# Patient Record
Sex: Female | Born: 1955 | Race: White | Hispanic: No | Marital: Married | Smoking: Never smoker
Health system: Southern US, Community
[De-identification: ages and names within clinical notes are randomized; demographics above are authoritative.]

## PROBLEM LIST (undated history)

## (undated) DIAGNOSIS — I10 Essential (primary) hypertension: Secondary | ICD-10-CM

## (undated) DIAGNOSIS — E785 Hyperlipidemia, unspecified: Secondary | ICD-10-CM

## (undated) DIAGNOSIS — E559 Vitamin D deficiency, unspecified: Secondary | ICD-10-CM

## (undated) DIAGNOSIS — R229 Localized swelling, mass and lump, unspecified: Secondary | ICD-10-CM

## (undated) DIAGNOSIS — M1990 Unspecified osteoarthritis, unspecified site: Secondary | ICD-10-CM

## (undated) DIAGNOSIS — K219 Gastro-esophageal reflux disease without esophagitis: Secondary | ICD-10-CM

## (undated) DIAGNOSIS — T7840XA Allergy, unspecified, initial encounter: Secondary | ICD-10-CM

## (undated) HISTORY — DX: Essential (primary) hypertension: I10

## (undated) HISTORY — DX: Localized swelling, mass and lump, unspecified: R22.9

## (undated) HISTORY — DX: Gastro-esophageal reflux disease without esophagitis: K21.9

## (undated) HISTORY — DX: Vitamin D deficiency, unspecified: E55.9

## (undated) HISTORY — PX: EYE SURGERY: SHX253

## (undated) HISTORY — DX: Hyperlipidemia, unspecified: E78.5

## (undated) HISTORY — DX: Allergy, unspecified, initial encounter: T78.40XA

## (undated) HISTORY — DX: Unspecified osteoarthritis, unspecified site: M19.90

---

## 1999-04-23 ENCOUNTER — Encounter: Admission: RE | Admit: 1999-04-23 | Discharge: 1999-04-23 | Payer: Self-pay | Admitting: Obstetrics and Gynecology

## 1999-04-23 ENCOUNTER — Encounter: Payer: Self-pay | Admitting: Obstetrics and Gynecology

## 1999-04-29 ENCOUNTER — Encounter (INDEPENDENT_AMBULATORY_CARE_PROVIDER_SITE_OTHER): Payer: Self-pay

## 1999-04-29 ENCOUNTER — Other Ambulatory Visit: Admission: RE | Admit: 1999-04-29 | Discharge: 1999-04-29 | Payer: Self-pay | Admitting: Obstetrics and Gynecology

## 2001-01-11 ENCOUNTER — Encounter: Admission: RE | Admit: 2001-01-11 | Discharge: 2001-01-11 | Payer: Self-pay | Admitting: Obstetrics and Gynecology

## 2001-01-11 ENCOUNTER — Encounter: Payer: Self-pay | Admitting: Obstetrics and Gynecology

## 2002-04-11 ENCOUNTER — Encounter: Admission: RE | Admit: 2002-04-11 | Discharge: 2002-04-11 | Payer: Self-pay | Admitting: Obstetrics and Gynecology

## 2002-04-11 ENCOUNTER — Encounter: Payer: Self-pay | Admitting: Obstetrics and Gynecology

## 2002-06-24 ENCOUNTER — Emergency Department (HOSPITAL_COMMUNITY): Admission: EM | Admit: 2002-06-24 | Discharge: 2002-06-24 | Payer: Self-pay

## 2003-04-10 ENCOUNTER — Encounter: Admission: RE | Admit: 2003-04-10 | Discharge: 2003-04-10 | Payer: Self-pay | Admitting: Obstetrics and Gynecology

## 2004-03-06 ENCOUNTER — Ambulatory Visit: Payer: Self-pay | Admitting: Cardiology

## 2004-04-09 ENCOUNTER — Ambulatory Visit: Payer: Self-pay | Admitting: Cardiology

## 2004-04-30 ENCOUNTER — Encounter: Admission: RE | Admit: 2004-04-30 | Discharge: 2004-04-30 | Payer: Self-pay | Admitting: Obstetrics and Gynecology

## 2005-05-15 ENCOUNTER — Encounter: Admission: RE | Admit: 2005-05-15 | Discharge: 2005-05-15 | Payer: Self-pay | Admitting: Obstetrics and Gynecology

## 2006-03-18 ENCOUNTER — Ambulatory Visit: Payer: Self-pay | Admitting: Cardiology

## 2006-03-18 ENCOUNTER — Observation Stay (HOSPITAL_COMMUNITY): Admission: EM | Admit: 2006-03-18 | Discharge: 2006-03-19 | Payer: Self-pay | Admitting: Emergency Medicine

## 2006-03-20 ENCOUNTER — Ambulatory Visit: Payer: Self-pay

## 2006-04-22 ENCOUNTER — Ambulatory Visit: Payer: Self-pay | Admitting: Cardiology

## 2006-05-18 ENCOUNTER — Encounter: Admission: RE | Admit: 2006-05-18 | Discharge: 2006-05-18 | Payer: Self-pay | Admitting: Obstetrics and Gynecology

## 2007-05-21 ENCOUNTER — Encounter: Admission: RE | Admit: 2007-05-21 | Discharge: 2007-05-21 | Payer: Self-pay | Admitting: Obstetrics and Gynecology

## 2007-05-26 ENCOUNTER — Ambulatory Visit: Payer: Self-pay | Admitting: Cardiology

## 2009-05-09 ENCOUNTER — Encounter: Admission: RE | Admit: 2009-05-09 | Discharge: 2009-05-09 | Payer: Self-pay | Admitting: Family Medicine

## 2009-07-18 ENCOUNTER — Encounter: Admission: RE | Admit: 2009-07-18 | Discharge: 2009-07-18 | Payer: Self-pay | Admitting: Obstetrics and Gynecology

## 2010-08-05 ENCOUNTER — Other Ambulatory Visit: Payer: Self-pay | Admitting: Family Medicine

## 2010-08-05 DIAGNOSIS — Z1231 Encounter for screening mammogram for malignant neoplasm of breast: Secondary | ICD-10-CM

## 2010-08-06 NOTE — Assessment & Plan Note (Signed)
[facility]                            CARDIOLOGY OFFICE NOTE   AMAKA, GLUTH                       MRN:          [id number]  DATE:05/26/2007                            DOB:          Jan 01, 1956    REASON FOR PRESENTATION:  Evaluate patient with palpitations and  hypertension.   HISTORY OF PRESENT ILLNESS:  The patient was in this clinic in December  2005 and again in late 2007.  In 2005 she had an exercise treadmill test  which was negative for any evidence of ischemia.  In 2007 she had a  stress perfusion study which demonstrated no sign of scar or ischemia.  She was referred back because she is wanting to embark on an exercise  regimen.  She needs to lose weight and she is upset that she has not.  She is also referred back because she had palpitations once after doing  activity.  She had been very sedentary.  In December of last year she  moved some furniture to put up a Christmas tree.  She said that night  she noted her heart rate to be 125 and sustained.  Her blood pressure  was elevated in the 160s.  It came down by the next morning.  She has  not had any further episodes of tachypalpitations or irregular heart  beat since then.  She has had no presyncope or syncope.  She keeps her  blood pressure routinely and though it fluctuates slightly, I think the  mean is in the high 120s to low 130s systolic and diastolics in the 70s-  80s.  She has not been having any chest discomfort, neck or arm  discomfort.  She has not been having any shortness of breath nor PND or  orthopnea.  Again, she has not been active.   PAST MEDICAL HISTORY:  1. Hypertension.  2. Dyslipidemia.   PAST SURGICAL HISTORY:  C-section.   ALLERGIES:  PENICILLIN, SULFA.   MEDICATIONS:  1. Crestor 5 mg daily.  2. Metoprolol 100 mg daily.  3. Calcium.  4. Multivitamin.  5. Cymbalta.  6. Amlodipine 5 mg daily.  7. Lisinopril 20 mg b.i.d..   SOCIAL HISTORY:  The  patient is retired from [employer].  She is married and  has two children.  She quit smoking in 1974 after one pack of cigarettes  for 20 years.  She does not drink alcohol.   FAMILY HISTORY:  Contributory for two brothers dying with myocardial  infarction.  Her father had later-onset heart disease.   REVIEW OF SYSTEMS:  As stated in the HPI, otherwise negative for other  systems.   PHYSICAL EXAMINATION:  The patient is in no distress.  Blood pressure 124/70, heart rate 66 and regular, body mass index 26.  HEENT:  Eyelids unremarkable.  Pupils equal, round and react to light,  fundi not visualized, oral mucosa remarkable.  NECK:  No jugular distention at 45 degrees, carotid upstroke brisk and  symmetric, no bruits, no thyromegaly.  LYMPHATICS:  No cervical, axillary or inguinal adenopathy.  LUNGS:  Clear to auscultation bilaterally.  BACK:  No costovertebral angle mass.  CHEST:  Unremarkable.  HEART:  PMI not displaced or sustained, S1 and S2 within normal limits,  no S3, no S4, no clicks, no rubs, no murmurs.  ABDOMEN:  Obese, positive bowel sounds, normal in frequency and pitch,  no bruits, no rebound, no guarding, no midline pulsatile mass.  No  hepatomegaly, no splenomegaly.  SKIN:  No rashes, no nodules.  EXTREMITIES:  2+ pulses throughout, no edema, no cyanosis, no clubbing.  NEUROLOGIC:  Oriented to person, place, time.  Cranial nerves II-XII  grossly intact.  Motor grossly intact.   EKG:  Sinus rhythm, rate 76, axis within normal limits, intervals within  normal limits, poor anterior R-wave progression, no acute ST-wave  change.   ASSESSMENT/PLAN:  1. Palpitations.  The patient had one episode of sustained      tachypalpitations but has not had a repeat of this.  At this point      no further cardiovascular testing is suggested.  2. Obesity.  I am glad that she wants to lose weight, and diet and      exercise is the right way to do this.  I suggested a plan on an       exercise treadmill; however, she has had two stress tests and was      reluctant to have this done again.  She remembers the guidelines I      gave her for exercise based on her first study.  She will try to      check her own blood pressure with exercise to make sure she is not      having any accelerated blood pressure response.  I would not think      that she had any new obstructive coronary disease and would not      need further testing for this.  I did discuss with her a minimum      exercise regimen of 30 minutes 5 days a week.  New guidelines last      year suggested up to 300 minutes a week afforded additional      benefit.  3. Hypertension.  Her blood pressure is well-controlled.  She was      anxious about some blood pressures occasionally 140s-150s but I      tried to reassure her.  I think overall her pressure is well-      controlled and with weight loss will even be better.  I would not      suggest changing her medical regimen.  4. Follow-up can be in this clinic as needed.    Rollene Rotunda, MD, [facility]  Electronically Signed   JH/MedQ  DD: 05/26/2007  DT: 05/27/2007  Job #: [id number]   cc:   Lindaann Pascal, PA

## 2010-08-09 NOTE — Discharge Summary (Signed)
Gina Murray, Gina Murray NO.:  [id number]   MEDICAL RECORD NO.:  [id number]          PATIENT TYPE:  INP   LOCATION:  2034                         FACILITY:  [facility]   PHYSICIAN:  Rollene Rotunda, MD, FACCDATE OF BIRTH:  [birth year]   DATE OF ADMISSION:  03/18/2006  DATE OF DISCHARGE:  03/19/2006                               DISCHARGE SUMMARY   PRIMARY CARDIOLOGIST:  Dr. Antoine Poche   PRIMARY CARE:  The patient is followed by Lindaann Pascal, PA at [facility]   DISCHARGING PHYSICIAN:  Dr. Antoine Poche   DISCHARGING DIAGNOSES:  1. Atypical chest pain, no subjective finding/evidence of ischemia.      Patient to be discharged home to follow up with exercise Cardiolite      outpatient.  2. Hypertension with lisinopril initiated during this hospitalization.   PAST MEDICAL HISTORY:  Includes:  1. Hypertension x3 years.  2. Hypercholesteremia.  3. Obesity.  4. Status post C-section.  5. Status post gated exercise in 2005.   PROCEDURES THIS ADMISSION:  1. Chest x-ray showed no acute cardiopulmonary disease.  2. EKG showed sinus rhythm without acute ST or T wave changes.   HOSPITAL COURSE:  [name] is a 55 year old female previously seen by  Dr. Antoine Poche approximately three years ago at which time she had a gated  stress test, no followup since that time, is followed by [facility].  She presented to [facility] emergency room  complaining of chest discomfort x1 week, she described it as a mid-  sternal heaviness with heartburn, she is taking Tums with relief,  however pain returns with some palpitations in chest, no other  associated symptoms.  [name] states she had been under a lot of  stress caring for her mother with Alzheimer's.  She also noted her blood  pressure to be elevated over the last few days prior to admission,  previously had been under control.  [name] felt like the additional  stress was  wearing on her.  She was seen by Dr. Juanda Chance and admitted to  telemetry, cardiac markers were cycled, EKG was without acute changes,  previous exercise stress was negative three years ago.  Dr. Antoine Poche in  to see patient on day of discharge, cardiac troponin negative x2 sets,  patient's blood pressure more control at 141/72, heart rate in the 70s,  afebrile, no further episodes of chest discomfort.  Patient being  discharged home to follow up outpatient.  I have scheduled her for a  stress Myoview on December 28 at 12 noon, she  has been given the  discharge instructions regarding her stress test and then to follow up  with Dr. Antoine Poche January 30 at 1:15 p.m. in [place].  At time of  discharge, she is given a prescription for nitroglycerin p.r.n.,  lisinopril 5 mg p.o. daily which is new, Toprol XL 100 mg daily, Crestor  5 mg  daily, aspirin 81 mg daily and Protonix 40 mg daily.  Patient also  scheduled for a B-Met on January 30 at 1:15 when she sees Dr.  Hochrein.   Duration of discharge encounter is 30 minutes.      Dorian Pod, ACNP      Rollene Rotunda, MD, [facility]  Electronically Signed    MB/MEDQ  D:  05/22/2006  T:  05/23/2006  Job:  [id number]   cc:   Lindaann Pascal, P.A.

## 2010-08-09 NOTE — Assessment & Plan Note (Signed)
[facility]                            CARDIOLOGY OFFICE NOTE   Gina, Murray                       MRN:          [id number]  DATE:04/22/2006                            DOB:          11/16/1955    PRIMARY CARE PHYSICIAN:  Lindaann Pascal, P.A.   REASON FOR PRESENTATION:  Evaluate patient with chest pain.   HISTORY OF PRESENT ILLNESS:  The patient was admitted on December 26  with chest discomfort.  She ruled out for myocardial infarction.  She  had a stress perfusion study as an outpatient that demonstrated an EF of  78% with no evidence of ischemia or infarct.  She subsequently has had  no further chest pain.  In particular, she started Prilosec and has had  resolution of her discomfort.  She denies any shortness of breath and  has had no PND or orthopnea.  She has not had any palpitation,  presyncope or syncope.   PAST MEDICAL HISTORY:  Hypertension, dyslipidemia, C-section.   ALLERGIES:  PENICILLIN and SULFA.   CURRENT MEDICATIONS:  1. Crestor 5 mg daily.  2. Lisinopril 5 mg daily.  3. Metoprolol 100 mg daily.  4. Protonix 40 mg daily.  5. Calcium.  6. Multivitamin.   REVIEW OF SYSTEMS:  As stated in the HPI.  Otherwise negative for other  systems.   PHYSICAL EXAMINATION:  The patient is in no distress.  Her blood pressure 150/80, heart rate 73 and regular, weight 172 pounds.  Body mass index 30.  HEENT:  Eyes unremarkable, pupils equal, round and reactive to light.  Fundi not visualized.  Oral mucosa unremarkable.  NECK:  No jugular venous distention.  Waveform within normal limits,  carotid upstroke brisk and symmetrical.  No bruits. No thyromegaly.  LYMPHATICS:  No cervical, axillary, or inguinal adenopathy.  LUNGS:  Clear to auscultation bilaterally.  BACK:  No costovertebral angle tenderness.  CHEST:  Unremarkable.  HEART:  PMI not displaced or sustained.  S1 and S2 within normal limits,  no S3, no S4, no murmurs.  ABDOMEN:   Obese, positive bowel sounds.  Normal in frequency and pitch,  no bruits, no rebound, no guarding, no midline pulsatile mass, no  hepatosplenomegaly, no splenomegaly.  SKIN:  No rashes.  No nodules.  EXTREMITIES:  2+ pulses throughout, no edema, no cyanosis, no clubbing.  NEURO:  Oriented to person, place and time.  Cranial nerves II through  XII grossly intact.  Motor grossly intact.   EKG:  Sinus rhythm, rate 73, axis within normal limits. Intervals within  normal limits, no acute ST-T wave changes.   ASSESSMENT AND PLAN:  1. Chest discomfort.  The patient's chest discomfort probably has a      gastrointestinal etiology.  I told her that she should continue the      Protonix only for a month.  She should stop it and if her symptoms      come back go to see her primary caregiver.  She may need      gastrointestinal evaluation.  2. Hypertension.  The blood pressure has been elevated  at multiple      visits.  I have taken the liberty of increasing her Lisinopril to      10 mg daily.  She has been given written instructions to get a BMET      in 2 weeks.  3. Obesity.  She understands the need to lose weight with diet and      exercise.  4. Followup.  I will see her back as needed.     Rollene Rotunda, MD, [facility]  Electronically Signed    JH/MedQ  DD: 04/22/2006  DT: 04/22/2006  Job #: [id number]   cc:   Gae Gallop. Long

## 2010-08-09 NOTE — H&P (Signed)
Gina Murray, Gina Murray NO.:  [id number]   MEDICAL RECORD NO.:  [id number]          PATIENT TYPE:  EMS   LOCATION:  MAJO                         FACILITY:  [facility]   PHYSICIAN:  Everardo Beals. Juanda Chance, MD, FACCDATE OF BIRTH:  June 30, 1955   DATE OF ADMISSION:  03/18/2006  DATE OF DISCHARGE:                              HISTORY & PHYSICAL   PRIMARY CARDIOLOGIST:  Dr. Rollene Rotunda.   PRIMARY CARE Aryanah Enslow:  Lindaann Pascal, P.A.   HISTORY OF PRESENT ILLNESS:  This is a 55 year old pleasant Caucasian  female who has been experiencing chest pressure over the last couple of  weeks with and without exertion.  The patient also complained of some  mild heartburn-type symptoms.  She took some Tums and the pain went  away.  The pain does come back, lasting approximately half a day at a  time.  The patient has been noticing some fluttering in her chest,  lasting 1 to 2 seconds, with and without exertion.  The pain is not  awaking her.  It is not associated with nausea, vomiting, diaphoresis,  dizziness, or shortness of breath.  The patient states that if she does  not think about it, she normally does not feel it, but at other times  she can feel it and it just feels like pressure.   The patient admits to being under a lot of stress at home.  She has a  mother with Alzheimer's whom she takes care of.  Stays 8 hours a day  with her, along with her husband's father is dying, and she is concerned  and taking care of him as well.  He is part of a hospice program.  The  patient is also very much involved with her family and easily worries  about them and is usually busy taking care of them.   The patient denies any medical noncompliance.  She states that she is  trying to lose some weight, but it has been difficult for her.  She  states that she has noticed the pressure over the 2 weeks and decided to  see her primary care physician because of this.  In his office, her  blood pressure  systolically was 190.  She was given nitroglycerin  sublingual and transferred to [facility] ER for further evaluation.  On  arrival, blood pressure was 165/75, pulse 80, respirations 22, with a  temperature of 97.5.   PAST MEDICAL HISTORY:  Includes:  1. Hypertension x3 years.  2. Hypercholesterolemia.  3. Obesity.   PAST CARDIAC WORKUP:  The patient did have a treadmill stress test in  2005 by Dr. Rollene Rotunda, which was negative, with poor exercise  tolerance.  The patient was advised to lose weight and monitor her blood  pressure.   PAST SURGICAL HISTORY:  C-section.   SOCIAL HISTORY:  She lives in [place] with her husband.  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  She  is married.  Has one daughter.  Denies using alcohol or tobacco.  No  illicit  drug use.  Does not get any regular exercise.  No herbal  medicine use.   FAMILY HISTORY:  Mother with Alzheimer's, CHF, and hypertension.  Father  died of kidney disease and coronary artery disease.  She has one sister  with COPD.   REVIEW OF SYSTEMS:  As above; otherwise negative.   CURRENT MEDICATIONS:  At home:  1. Toprol XL 100 mg daily.  2. Crestor 5 mg q.h.s.  3. Multivitamin once a day.  4. Calcium once a day.   ALLERGIES:  1. SULFA.  2. PENICILLIN.   PHYSICAL EXAMINATION:  GENERAL:  She is awake, alert, oriented, in no  acute distress, with complaints of some minor chest discomfort.  HEENT:  Head is normocephalic, atraumatic.  Eyes:  PERRLA.  Mucous  membranes:  Mouth pink and moist.  Tongue is midline.  NECK:  Supple.  No JVD or carotid bruits appreciated.  CARDIOVASCULAR:  Regular rate and rhythm with S4 murmur auscultated and  1/6 systolic murmur auscultated at the left sternal border.  LUNGS:  Clear to auscultation without wheezes, rales, or rhonchi.  ABDOMEN:  Soft, nontender, with 2+ bowel sounds.  EXTREMITIES:  Without clubbing, cyanosis, or edema.  SKIN:  Warm  and dry.  NEURO:  Intact.   LABORATORY TESTS:  EKG revealing normal sinus rhythm with a ventricular  rate of 70 beats per minute.  Labs are pending.  Chest x-ray is pending.  Point-of-care markers are pending.   IMPRESSION:  1. Hypertension.  2. Chest pain.  Rule out cardiac etiology.  3. Hypercholesterolemia.  4. Stressful life circumstances.   PLAN:  The patient was seen and examined by Dr. Charlies Constable.  Our plan  will be to admit this patient to rule out ACS.  Restart the patient on  her Toprol-XL 100 mg once a day.  Start lisinopril 5 mg once a day.  Restart her Crestor as at home.  Start her on a proton pump inhibitor,  Protonix 40 mg once a day, and enteric-coated aspirin once a day.  We  will not start on any Lovenox or heparin at this time, secondary to  hypertension.  If cardiac enzymes are positive, then would institute  heparin or Lovenox.   The patient verbalizes understanding about need to be admitted and have  blood pressure better controlled.  Would also recommend case management  to assist her in the care of her mother with Alzheimer's to allow her to  have a break on occasion.  The patient will be followed throughout  hospitalization, and we will make further recommendations throughout  hospital course as needed.      Bettey Mare. Lyman Bishop, NP      Everardo Beals. Juanda Chance, MD, [facility]  Electronically Signed    KML/MEDQ  D:  03/18/2006  T:  03/19/2006  Job:  [id number]   cc:   Lindaann Pascal, P.A.

## 2010-08-14 ENCOUNTER — Ambulatory Visit
Admission: RE | Admit: 2010-08-14 | Discharge: 2010-08-14 | Disposition: A | Discharge status: Home / Self Care | Payer: BC Managed Care – PPO | Source: Ambulatory Visit | Attending: Family Medicine | Admitting: Family Medicine

## 2010-08-14 DIAGNOSIS — Z1231 Encounter for screening mammogram for malignant neoplasm of breast: Secondary | ICD-10-CM

## 2012-01-20 ENCOUNTER — Other Ambulatory Visit: Payer: Self-pay | Admitting: Family Medicine

## 2012-01-20 DIAGNOSIS — R1031 Right lower quadrant pain: Secondary | ICD-10-CM

## 2012-01-21 ENCOUNTER — Other Ambulatory Visit (HOSPITAL_COMMUNITY): Payer: Self-pay

## 2012-01-23 ENCOUNTER — Ambulatory Visit (HOSPITAL_COMMUNITY): Payer: Self-pay

## 2012-01-23 ENCOUNTER — Ambulatory Visit (HOSPITAL_COMMUNITY)
Admission: RE | Admit: 2012-01-23 | Discharge: 2012-01-23 | Disposition: A | Discharge status: Home / Self Care | Payer: BC Managed Care – PPO | Source: Ambulatory Visit | Attending: Family Medicine | Admitting: Family Medicine

## 2012-01-23 ENCOUNTER — Other Ambulatory Visit: Payer: Self-pay | Admitting: Family Medicine

## 2012-01-23 DIAGNOSIS — N200 Calculus of kidney: Secondary | ICD-10-CM | POA: Insufficient documentation

## 2012-01-23 DIAGNOSIS — R1031 Right lower quadrant pain: Secondary | ICD-10-CM

## 2012-01-23 DIAGNOSIS — R599 Enlarged lymph nodes, unspecified: Secondary | ICD-10-CM | POA: Insufficient documentation

## 2012-01-23 DIAGNOSIS — Q619 Cystic kidney disease, unspecified: Secondary | ICD-10-CM | POA: Insufficient documentation

## 2012-01-23 MED ORDER — IOHEXOL 300 MG/ML  SOLN
100.0000 mL | Freq: Once | INTRAMUSCULAR | Status: AC | PRN
  Administered 2012-01-23: 100 mL via INTRAVENOUS

## 2012-07-15 ENCOUNTER — Other Ambulatory Visit: Payer: Self-pay | Admitting: Physician Assistant

## 2012-08-04 ENCOUNTER — Other Ambulatory Visit: Payer: Self-pay | Admitting: Physician Assistant

## 2012-08-13 ENCOUNTER — Ambulatory Visit (INDEPENDENT_AMBULATORY_CARE_PROVIDER_SITE_OTHER): Payer: BC Managed Care – PPO | Admitting: Family Medicine

## 2012-08-13 ENCOUNTER — Encounter: Payer: Self-pay | Admitting: Family Medicine

## 2012-08-13 VITALS — BP 161/72 | HR 62 | Temp 97.0°F | Ht 62.0 in | Wt 172.0 lb

## 2012-08-13 DIAGNOSIS — E785 Hyperlipidemia, unspecified: Secondary | ICD-10-CM

## 2012-08-13 DIAGNOSIS — R229 Localized swelling, mass and lump, unspecified: Secondary | ICD-10-CM

## 2012-08-13 DIAGNOSIS — R609 Edema, unspecified: Secondary | ICD-10-CM

## 2012-08-13 DIAGNOSIS — I10 Essential (primary) hypertension: Secondary | ICD-10-CM

## 2012-08-13 DIAGNOSIS — E559 Vitamin D deficiency, unspecified: Secondary | ICD-10-CM

## 2012-08-13 LAB — COMPLETE METABOLIC PANEL WITH GFR
ALT: 25 U/L (ref 0–35)
AST: 23 U/L (ref 0–37)
Albumin: 4.2 g/dL (ref 3.5–5.2)
Alkaline Phosphatase: 114 U/L (ref 39–117)
BUN: 12 mg/dL (ref 6–23)
CO2: 28 mEq/L (ref 19–32)
Calcium: 10.7 mg/dL — ABNORMAL HIGH (ref 8.4–10.5)
Chloride: 102 mEq/L (ref 96–112)
Creat: 0.84 mg/dL (ref 0.50–1.10)
GFR, Est African American: 89 mL/min
GFR, Est Non African American: 77 mL/min
Glucose, Bld: 104 mg/dL — ABNORMAL HIGH (ref 70–99)
Potassium: 4.9 mEq/L (ref 3.5–5.3)
Sodium: 140 mEq/L (ref 135–145)
Total Bilirubin: 0.5 mg/dL (ref 0.3–1.2)
Total Protein: 7.4 g/dL (ref 6.0–8.3)

## 2012-08-13 MED ORDER — LISINOPRIL 20 MG PO TABS: ORAL_TABLET | ORAL | Status: DC

## 2012-08-13 MED ORDER — METOPROLOL SUCCINATE ER 100 MG PO TB24: ORAL_TABLET | ORAL | Status: DC

## 2012-08-13 MED ORDER — ATORVASTATIN CALCIUM 40 MG PO TABS: 40.0000 mg | ORAL_TABLET | Freq: Every day | ORAL | Status: DC

## 2012-08-13 NOTE — Patient Instructions (Signed)
      Dr Jobie Popp's Recommendations  Diet and Exercise discussed with patient.  For nutrition information, I recommend books:  1).Eat to Live by Dr Joel Fuhrman. 2).Prevent and Reverse Heart Disease by Dr Caldwell Esselstyn. 3) Dr Neal Barnard's Book: Reversing Diabetes  Exercise recommendations are:  If unable to walk, then the patient can exercise in a chair 3 times a day. By flapping arms like a bird gently and raising legs outwards to the front.  If ambulatory, the patient can go for walks for 30 minutes 3 times a week. Then increase the intensity and duration as tolerated.  Goal is to try to attain exercise frequency to 5 times a week.  If applicable: Best to perform resistance exercises (machines or weights) 2 days a week and cardio type exercises 3 days per week.  

## 2012-08-13 NOTE — Progress Notes (Signed)
Patient ID: Gina Murray, female   DOB: 11/20/1955, 57 y.o.   MRN: [id number] SUBJECTIVE: HPI: Patient is here for follow up of hypertension/hyperlipidemia:  denies Headache;deniesChest Pain;denies weakness;denies Shortness of Breath or Orthopnea;denies Visual changes;denies palpitations;denies cough;denies pedal edema;denies symptoms of TIA or stroke; admits to Compliance with medications. denies Problems with medications.   PMH/PSH: reviewed/updated in Epic  SH/FH: reviewed/updated in Epic  Allergies: reviewed/updated in Epic  Medications: reviewed/updated in Epic  Immunizations: reviewed/updated in Epic  ROS: As above in the HPI. All other systems are stable or negative.  OBJECTIVE: APPEARANCE:  Patient in no acute distress.The patient appeared well nourished and normally developed. Acyanotic. Waist: VITAL SIGNS:BP 161/72  Pulse 62  Temp(Src) 97 F (36.1 C) (Oral)  Ht 5\' 2"  (1.575 m)  Wt 172 lb (78.019 kg)  BMI 31.45 kg/m2  LMP 08/13/2001  obesity SKIN: warm and  Dry without overt rashes, tattoos and scars. There are many subcutaneous nodules in the lateral right thigh. 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  HEAD and Neck: without JVD, Head and scalp: normal Eyes:No scleral icterus. Fundi normal, eye movements normal. Ears: Auricle normal, canal normal, Tympanic membranes normal, insufflation normal. Nose: normal Throat: normal Neck & thyroid: normal  CHEST & LUNGS: Chest wall: normal Lungs: Clear  CVS: Reveals the PMI to be normally located. Regular rhythm, First and Second Heart sounds are normal,  absence of murmurs, rubs or gallops. Peripheral vasculature: Radial pulses: normal Dorsal pedis pulses: normal Posterior pulses: normal  ABDOMEN:  Appearance:obese Benign,, no organomegaly, no masses, no Abdominal Aortic enlargement. No Guarding , no rebound. No Bruits. Bowel sounds: normal  RECTAL:  N/A GU: N/A  EXTREMETIES: nonedematous. Both Femoral and Pedal pulses are normal.  MUSCULOSKELETAL:  Spine: normal Joints: intact  NEUROLOGIC: oriented to time,place and person; nonfocal. Strength is normal Sensory is normal Reflexes are normal Cranial Nerves are normal.  ASSESSMENT: HTN (hypertension) - Plan: metoprolol succinate (TOPROL-XL) 100 MG 24 hr tablet, lisinopril (PRINIVIL,ZESTRIL) 20 MG tablet, COMPLETE METABOLIC PANEL WITH GFR  Edema  Multiple skin nodules  HLD (hyperlipidemia) - Plan: atorvastatin (LIPITOR) 40 MG tablet, COMPLETE METABOLIC PANEL WITH GFR, NMR Lipoprofile with Lipids  Unspecified vitamin D deficiency - Plan: Vitamin D 25 hydroxy    PLAN:       Dr Woodroe Mode Recommendations  Diet and Exercise discussed with patient.  For nutrition information, I recommend books:  1).Eat to Live by Dr Monico Hoar. 2).Prevent and Reverse Heart Disease by Dr Suzzette Righter. 3) Dr Katherina Right Book: Reversing Diabetes  Exercise recommendations are:  If unable to walk, then the patient can exercise in a chair 3 times a day. By flapping arms like a bird gently and raising legs outwards to the front.  If ambulatory, the patient can go for walks for 30 minutes 3 times a week. Then increase the intensity and duration as tolerated.  Goal is to try to attain exercise frequency to 5 times a week.  If applicable: Best to perform resistance exercises (machines or weights) 2 days a week and cardio type exercises 3 days per week.   Orders Placed This Encounter  Procedures  . COMPLETE METABOLIC PANEL WITH GFR  . NMR Lipoprofile with Lipids  . Vitamin D 25 hydroxy   No results found for this or any previous visit. Meds ordered this encounter  Medications  . amLODipine (NORVASC) 10 MG tablet    Sig: Take 10  mg by mouth daily.   Marland Kitchen DISCONTD: atorvastatin (LIPITOR) 40 MG tablet    Sig:   . Cholecalciferol (VITAMIN D) 2000 UNITS tablet    Sig: Take  2,000 Units by mouth daily.  Marland Kitchen aspirin 81 MG tablet    Sig: Take 81 mg by mouth daily.  . Calcium Carbonate-Vitamin D (CALCIUM + D PO)    Sig: Take by mouth.  . metoprolol succinate (TOPROL-XL) 100 MG 24 hr tablet    Sig: TAKE ONE TABLET BY MOUTH ONE TIME DAILY    Dispense:  30 tablet    Refill:  11  . atorvastatin (LIPITOR) 40 MG tablet    Sig: Take 1 tablet (40 mg total) by mouth daily.    Dispense:  30 tablet    Refill:  11  . lisinopril (PRINIVIL,ZESTRIL) 20 MG tablet    Sig: TAKE ONE TABLET BY MOUTH TWICE DAILY    Dispense:  60 tablet    Refill:  11  discussed diet and exercise at length.  RTc 4 months. Expect her to drop off BP reading in 6 weeks.  Laiyla Slagel P. Modesto Charon, M.D.

## 2012-08-14 ENCOUNTER — Other Ambulatory Visit: Payer: Self-pay | Admitting: Physician Assistant

## 2012-08-14 LAB — VITAMIN D 25 HYDROXY (VIT D DEFICIENCY, FRACTURES): Vit D, 25-Hydroxy: 49 ng/mL (ref 30–89)

## 2012-08-18 LAB — NMR LIPOPROFILE WITH LIPIDS
Cholesterol, Total: 182 mg/dL (ref <200)
HDL Particle Number: 44.4 umol/L (ref >=30.5)
HDL Size: 9.6 nm (ref >=9.2)
HDL-C: 77 mg/dL (ref >=40)
LDL (calc): 92 mg/dL (ref <100)
LDL Particle Number: 883 nmol/L (ref <1000)
LDL Size: 21.5 nm (ref >20.5)
LP-IR Score: 29 (ref <=45)
Large HDL-P: 13.6 umol/L (ref >=4.8)
Large VLDL-P: 2.8 nmol/L — ABNORMAL HIGH (ref <=2.7)
Small LDL Particle Number: 253 nmol/L (ref <=527)
Triglycerides: 65 mg/dL (ref <150)
VLDL Size: 47.5 nm — ABNORMAL HIGH (ref <=46.6)

## 2012-08-19 NOTE — Progress Notes (Signed)
Quick Note:  Lab result close to goal. No change in Medications for now. No Change in plans and follow up.  ______ 

## 2012-12-16 ENCOUNTER — Ambulatory Visit (INDEPENDENT_AMBULATORY_CARE_PROVIDER_SITE_OTHER): Payer: BC Managed Care – PPO | Admitting: Family Medicine

## 2012-12-16 ENCOUNTER — Encounter: Payer: Self-pay | Admitting: Family Medicine

## 2012-12-16 VITALS — BP 147/75 | HR 72 | Temp 98.1°F | Wt 172.8 lb

## 2012-12-16 DIAGNOSIS — R739 Hyperglycemia, unspecified: Secondary | ICD-10-CM

## 2012-12-16 DIAGNOSIS — E785 Hyperlipidemia, unspecified: Secondary | ICD-10-CM | POA: Insufficient documentation

## 2012-12-16 DIAGNOSIS — I10 Essential (primary) hypertension: Secondary | ICD-10-CM

## 2012-12-16 DIAGNOSIS — R229 Localized swelling, mass and lump, unspecified: Secondary | ICD-10-CM | POA: Insufficient documentation

## 2012-12-16 DIAGNOSIS — R7309 Other abnormal glucose: Secondary | ICD-10-CM

## 2012-12-16 DIAGNOSIS — E559 Vitamin D deficiency, unspecified: Secondary | ICD-10-CM

## 2012-12-16 DIAGNOSIS — Z23 Encounter for immunization: Secondary | ICD-10-CM | POA: Insufficient documentation

## 2012-12-16 LAB — POCT GLYCOSYLATED HEMOGLOBIN (HGB A1C): Hemoglobin A1C: 5.4

## 2012-12-16 MED ORDER — AMLODIPINE BESYLATE 10 MG PO TABS: ORAL_TABLET | ORAL | Status: DC

## 2012-12-16 NOTE — Progress Notes (Signed)
Patient ID: Gina Murray, female   DOB: [birth year], 57 y.o.   MRN: [id number] SUBJECTIVE: CC: Chief Complaint  Patient presents with  . Follow-up    4 month follow up wants flu shot  . Medication Refill    amlodipine  if she is to stay on it.     HPI:  Skin blemish / pigmentation of the legs no change.  Patient is here for follow up of hyperlipidemia/HTN/Vit D def: denies Headache;denies Chest Pain;denies weakness;denies Shortness of Breath and orthopnea;denies Visual changes;denies palpitations;denies cough;denies pedal edema;denies symptoms of TIA or stroke;deniesClaudication symptoms. admits to Compliance with medications; denies Problems with medications. BPs 117-130s/70s   Diet: not good could be better. Tired and sits and eats high carb , snacks at nights.   Past Medical History  Diagnosis Date  . Hyperlipidemia   . Hypertension   . Vitamin D deficiency   . Multiple skin nodules    Past Surgical History  Procedure Laterality Date  . Cesarean section     History   Social History  . Marital Status: Married    Spouse Name: N/A    Number of Children: N/A  . Years of Education: N/A   Occupational History  . Not on file.   Social History Main Topics  . Smoking status: Never Smoker   . Smokeless tobacco: Not on file  . Alcohol Use: Not on file  . Drug Use: Not on file  . Sexual Activity: Not on file   Other Topics Concern  . Not on file   Social History Narrative  . No narrative on file   No family history on file. Current Outpatient Prescriptions on File Prior to Visit  Medication Sig Dispense Refill  . amLODipine (NORVASC) 10 MG tablet TAKE ONE TABLET BY MOUTH ONE TIME DAILY  30 tablet  4  . aspirin 81 MG tablet Take 81 mg by mouth daily.      Marland Kitchen atorvastatin (LIPITOR) 40 MG tablet Take 1 tablet (40 mg total) by mouth daily.  30 tablet  11  . Cholecalciferol (VITAMIN D) 2000 UNITS tablet Take 2,000 Units by mouth daily.      Marland Kitchen lisinopril  (PRINIVIL,ZESTRIL) 20 MG tablet TAKE ONE TABLET BY MOUTH TWICE DAILY  60 tablet  11  . metoprolol succinate (TOPROL-XL) 100 MG 24 hr tablet TAKE ONE TABLET BY MOUTH ONE TIME DAILY  30 tablet  11   No current facility-administered medications on file prior to visit.   Allergies  Allergen Reactions  . Latex     Had hand rash yrs ago and was told "could be allergic " but pt states "not a definite thing"  . Penicillins   . Sulfa Antibiotics    Immunization History  Administered Date(s) Administered  . Influenza,inj,Quad PF,36+ Mos 12/16/2012   Prior to Admission medications   Medication Sig Start Date End Date Taking? Authorizing Provider  amLODipine (NORVASC) 10 MG tablet TAKE ONE TABLET BY MOUTH ONE TIME DAILY 08/14/12   Ileana Ladd, MD  aspirin 81 MG tablet Take 81 mg by mouth daily.    Historical Provider, MD  atorvastatin (LIPITOR) 40 MG tablet Take 1 tablet (40 mg total) by mouth daily. 08/13/12   Ileana Ladd, MD  Cholecalciferol (VITAMIN D) 2000 UNITS tablet Take 2,000 Units by mouth daily.    Historical Provider, MD  lisinopril (PRINIVIL,ZESTRIL) 20 MG tablet TAKE ONE TABLET BY MOUTH TWICE DAILY 08/13/12   Ileana Ladd, MD  metoprolol succinate (TOPROL-XL)  100 MG 24 hr tablet TAKE ONE TABLET BY MOUTH ONE TIME DAILY 08/13/12   Ileana Ladd, MD     ROS: As above in the HPI. All other systems are stable or negative.  OBJECTIVE: APPEARANCE:  Patient in no acute distress.The patient appeared well nourished and normally developed. Acyanotic. Waist: VITAL SIGNS:BP 147/75  Pulse 72  Temp(Src) 98.1 F (36.7 C) (Oral)  Wt 172 lb 12.8 oz (78.382 kg)  BMI 31.6 kg/m2  LMP 08/13/2001 WF  SKIN: warm and  Dry without overt rashes, tattoos and scars Hyperpigmentation around ankles and  Distal legs.  Benign strawberry and pigmented nevi.   HEAD and Neck: without JVD, Head and scalp: normal Eyes:No scleral icterus. Fundi normal, eye movements normal. Ears: Auricle normal,  canal normal, Tympanic membranes normal, insufflation normal. Nose: normal Throat: normal Neck & thyroid: normal  CHEST & LUNGS: Chest wall: normal Lungs: Clear  CVS: Reveals the PMI to be normally located. Regular rhythm, First and Second Heart sounds are normal,  absence of murmurs, rubs or gallops. Peripheral vasculature: Radial pulses: normal Dorsal pedis pulses: normal Posterior pulses: normal  ABDOMEN:  Appearance: Obese Benign, no organomegaly, no masses, no Abdominal Aortic enlargement. No Guarding , no rebound. No Bruits. Bowel sounds: normal  RECTAL: N/A GU: N/A  EXTREMETIES: trace edema  MUSCULOSKELETAL:  Spine: normal Joints: intact  NEUROLOGIC: oriented to time,place and person; nonfocal. Strength is normal Sensory is normal Reflexes are normal Cranial Nerves are normal.  Results for orders placed in visit on 08/13/12  COMPLETE METABOLIC PANEL WITH GFR      Result Value Range   Sodium 140  135 - 145 mEq/L   Potassium 4.9  3.5 - 5.3 mEq/L   Chloride 102  96 - 112 mEq/L   CO2 28  19 - 32 mEq/L   Glucose, Bld 104 (*) 70 - 99 mg/dL   BUN 12  6 - 23 mg/dL   Creat 4.78  2.95 - 6.21 mg/dL   Total Bilirubin 0.5  0.3 - 1.2 mg/dL   Alkaline Phosphatase 114  39 - 117 U/L   AST 23  0 - 37 U/L   ALT 25  0 - 35 U/L   Total Protein 7.4  6.0 - 8.3 g/dL   Albumin 4.2  3.5 - 5.2 g/dL   Calcium 30.8 (*) 8.4 - 10.5 mg/dL   GFR, Est African American 89     GFR, Est Non African American 77    NMR LIPOPROFILE WITH LIPIDS      Result Value Range   LDL Particle Number 883  <1000 nmol/L   LDL (calc) 92  <100 mg/dL   HDL-C 77  >=65 mg/dL   Triglycerides 65  <784 mg/dL   Cholesterol, Total 696  <200 mg/dL   HDL Particle Number 29.5  >=28.4 umol/L   Large HDL-P 13.6  >=4.8 umol/L   Large VLDL-P 2.8 (*) <=2.7 nmol/L   Small LDL Particle Number 253  <=527 nmol/L   LDL Size 21.5  >20.5 nm   HDL Size 9.6  >=9.2 nm   VLDL Size 47.5 (*) <=46.6 nm   LP-IR Score 29   <=45  VITAMIN D 25 HYDROXY      Result Value Range   Vit D, 25-Hydroxy 49  30 - 89 ng/mL    ASSESSMENT: Hypertension - Plan: CMP14+EGFR, amLODipine (NORVASC) 10 MG tablet  Hyperlipidemia - Plan: CMP14+EGFR, NMR, lipoprofile  Vitamin D deficiency - Plan: Vit D  25 hydroxy (  rtn osteoporosis monitoring)  Need for prophylactic vaccination and inoculation against influenza  Multiple skin nodules  Hyperglycemia - Plan: POCT glycosylated hemoglobin (Hb A1C), CMP14+EGFR   PLAN:  Orders Placed This Encounter  Procedures  . CMP14+EGFR  . NMR, lipoprofile  . Vit D  25 hydroxy (rtn osteoporosis monitoring)  . POCT glycosylated hemoglobin (Hb A1C)    Meds ordered this encounter  Medications  . amLODipine (NORVASC) 10 MG tablet    Sig: TAKE ONE TABLET BY MOUTH ONE TIME DAILY    Dispense:  30 tablet    Refill:  11    Discussed plant based diet, keep active.  Return in about 4 months (around 04/17/2013) for Recheck medical problems.  Cynitha Berte P. [place] Charon, M.D.

## 2012-12-17 LAB — CMP14+EGFR
ALT: 25 IU/L (ref 0–32)
AST: 22 IU/L (ref 0–40)
Albumin/Globulin Ratio: 1.8 (ref 1.1–2.5)
Albumin: 4.2 g/dL (ref 3.5–5.5)
Alkaline Phosphatase: 120 IU/L — ABNORMAL HIGH (ref 39–117)
BUN/Creatinine Ratio: 17 (ref 9–23)
BUN: 15 mg/dL (ref 6–24)
CO2: 28 mmol/L (ref 18–29)
Calcium: 10 mg/dL (ref 8.7–10.2)
Chloride: 104 mmol/L (ref 97–108)
Creatinine, Ser: 0.89 mg/dL (ref 0.57–1.00)
GFR calc Af Amer: 83 mL/min/{1.73_m2} (ref >59)
GFR calc non Af Amer: 72 mL/min/{1.73_m2} (ref >59)
Globulin, Total: 2.4 g/dL (ref 1.5–4.5)
Glucose: 98 mg/dL (ref 65–99)
Potassium: 4.5 mmol/L (ref 3.5–5.2)
Sodium: 143 mmol/L (ref 134–144)
Total Bilirubin: 0.3 mg/dL (ref 0.0–1.2)
Total Protein: 6.6 g/dL (ref 6.0–8.5)

## 2012-12-17 LAB — NMR, LIPOPROFILE
Cholesterol: 152 mg/dL (ref <200)
HDL Cholesterol by NMR: 75 mg/dL (ref >=40)
HDL Particle Number: 41.7 umol/L (ref >=30.5)
LDL Particle Number: 689 nmol/L (ref <1000)
LDL Size: 21.2 nm (ref >20.5)
LDLC SERPL CALC-MCNC: 68 mg/dL (ref <100)
LP-IR Score: <25 (ref <=45)
Small LDL Particle Number: <90 nmol/L (ref <=527)
Triglycerides by NMR: 43 mg/dL (ref <150)

## 2012-12-17 LAB — VITAMIN D 25 HYDROXY (VIT D DEFICIENCY, FRACTURES): Vit D, 25-Hydroxy: 37.8 ng/mL (ref 30.0–100.0)

## 2012-12-23 ENCOUNTER — Telehealth: Payer: Self-pay | Admitting: Family Medicine

## 2012-12-24 NOTE — Telephone Encounter (Signed)
Called and left message for pt and copy of labs mailed to pt

## 2013-04-22 ENCOUNTER — Encounter: Payer: Self-pay | Admitting: Family Medicine

## 2013-04-22 ENCOUNTER — Ambulatory Visit (INDEPENDENT_AMBULATORY_CARE_PROVIDER_SITE_OTHER): Payer: BC Managed Care – PPO | Admitting: Family Medicine

## 2013-04-22 VITALS — BP 140/81 | HR 74 | Temp 97.6°F | Ht 62.0 in | Wt 170.0 lb

## 2013-04-22 DIAGNOSIS — I10 Essential (primary) hypertension: Secondary | ICD-10-CM

## 2013-04-22 DIAGNOSIS — E559 Vitamin D deficiency, unspecified: Secondary | ICD-10-CM

## 2013-04-22 DIAGNOSIS — R229 Localized swelling, mass and lump, unspecified: Secondary | ICD-10-CM

## 2013-04-22 DIAGNOSIS — E785 Hyperlipidemia, unspecified: Secondary | ICD-10-CM

## 2013-04-22 DIAGNOSIS — K219 Gastro-esophageal reflux disease without esophagitis: Secondary | ICD-10-CM | POA: Insufficient documentation

## 2013-04-22 HISTORY — DX: Gastro-esophageal reflux disease without esophagitis: K21.9

## 2013-04-22 NOTE — Progress Notes (Signed)
Patient ID: Gina Murray, female   DOB: 06-10-1955, 58 y.o.   MRN: [id number] SUBJECTIVE: CC: Chief Complaint  Patient presents with  . Follow-up    4 month foillow up states she stopped her asa due to  "heartburn"     HPI:  Patient is here for follow up of hyperlipidemia/HTN: denies Headache;denies Chest Pain;denies weakness;denies Shortness of Breath and orthopnea;denies Visual changes;denies palpitations;denies cough;denies pedal edema;denies symptoms of TIA or stroke;deniesClaudication symptoms. admits to Compliance with medications; denies Problems with medications.  Having some heartburn in the evenings, with the choices of foods. Doesn't eat spicy foods , mostly it is greasy foods. Tums resolves it. Coffee. Eats whatever is available in th evenings. And at nights gets GERD.  BP usually normal and well controlled until she comes here.  Has eczema of the external ear canals. Filled with wax today. Needs clearing. She has betamethasone cream to put in her ear canals.  Past Medical History  Diagnosis Date  . Hyperlipidemia   . Hypertension   . Vitamin D deficiency   . Multiple skin nodules   . GERD (gastroesophageal reflux disease) 04/22/2013   Past Surgical History  Procedure Laterality Date  . Cesarean section     History   Social History  . Marital Status: Married    Spouse Name: N/A    Number of Children: N/A  . Years of Education: N/A   Occupational History  . Not on file.   Social History Main Topics  . Smoking status: Never Smoker   . Smokeless tobacco: Not on file  . Alcohol Use: Not on file  . Drug Use: Not on file  . Sexual Activity: Not on file   Other Topics Concern  . Not on file   Social History Narrative  . No narrative on file   No family history on file. Current Outpatient Prescriptions on File Prior to Visit  Medication Sig Dispense Refill  . amLODipine (NORVASC) 10 MG tablet TAKE ONE TABLET BY MOUTH ONE TIME DAILY  30 tablet  11  .  atorvastatin (LIPITOR) 40 MG tablet Take 1 tablet (40 mg total) by mouth daily.  30 tablet  11  . Cholecalciferol (VITAMIN D) 2000 UNITS tablet Take 2,000 Units by mouth daily.      Marland Kitchen lisinopril (PRINIVIL,ZESTRIL) 20 MG tablet TAKE ONE TABLET BY MOUTH TWICE DAILY  60 tablet  11  . metoprolol succinate (TOPROL-XL) 100 MG 24 hr tablet TAKE ONE TABLET BY MOUTH ONE TIME DAILY  30 tablet  11  . aspirin 81 MG tablet Take 81 mg by mouth daily.       No current facility-administered medications on file prior to visit.   Allergies  Allergen Reactions  . Latex     Had hand rash yrs ago and was told "could be allergic " but pt states "not a definite thing"  . Penicillins   . Sulfa Antibiotics    Immunization History  Administered Date(s) Administered  . Influenza,inj,Quad PF,36+ Mos 12/16/2012  . Zoster 04/21/2011   Prior to Admission medications   Medication Sig Start Date End Date Taking? Authorizing Provider  amLODipine (NORVASC) 10 MG tablet TAKE ONE TABLET BY MOUTH ONE TIME DAILY 12/16/12  Yes Vernie Shanks, MD  atorvastatin (LIPITOR) 40 MG tablet Take 1 tablet (40 mg total) by mouth daily. 08/13/12  Yes Vernie Shanks, MD  Cholecalciferol (VITAMIN D) 2000 UNITS tablet Take 2,000 Units by mouth daily.   Yes Historical Provider, MD  lisinopril (PRINIVIL,ZESTRIL) 20 MG tablet TAKE ONE TABLET BY MOUTH TWICE DAILY 08/13/12  Yes Vernie Shanks, MD  metoprolol succinate (TOPROL-XL) 100 MG 24 hr tablet TAKE ONE TABLET BY MOUTH ONE TIME DAILY 08/13/12  Yes Vernie Shanks, MD  aspirin 81 MG tablet Take 81 mg by mouth daily.    Historical Provider, MD     ROS: As above in the HPI. All other systems are stable or negative.  OBJECTIVE: APPEARANCE:  Patient in no acute distress.The patient appeared well nourished and normally developed. Acyanotic. Waist: VITAL SIGNS:BP 140/81  Pulse 74  Temp(Src) 97.6 F (36.4 C) (Oral)  Ht 5' 2" (1.575 m)  Wt 170 lb (77.111 kg)  BMI 31.09 kg/m2  LMP  08/13/2001   SKIN: warm and  Dry without overt rashes, tattoos and scars  HEAD and Neck: without JVD, Head and scalp: normal Eyes:No scleral icterus. Fundi normal, eye movements normal. Ears: Auricle normal, canal normal, Tympanic membranes normal, insufflation normal. Nose: normal Throat: normal Neck & thyroid: normal  CHEST & LUNGS: Chest wall: normal Lungs: Clear  CVS: Reveals the PMI to be normally located. Regular rhythm, First and Second Heart sounds are normal,  absence of murmurs, rubs or gallops. Peripheral vasculature: Radial pulses: normal Dorsal pedis pulses: normal Posterior pulses: normal  ABDOMEN:  Appearance: normal Benign, no organomegaly, no masses, no Abdominal Aortic enlargement. No Guarding , no rebound. No Bruits. Bowel sounds: normal  RECTAL: N/A GU: N/A  EXTREMETIES: nonedematous.  MUSCULOSKELETAL:  Spine: normal Joints: intact  NEUROLOGIC: oriented to time,place and person; nonfocal. Strength is normal Sensory is normal Reflexes are normal Cranial Nerves are normal.  Results for orders placed in visit on 12/16/12  CMP14+EGFR      Result Value Range   Glucose 98  65 - 99 mg/dL   BUN 15  6 - 24 mg/dL   Creatinine, Ser 0.89  0.57 - 1.00 mg/dL   GFR calc non Af Amer 72  >59 mL/min/1.73   GFR calc Af Amer 83  >59 mL/min/1.73   BUN/Creatinine Ratio 17  9 - 23   Sodium 143  134 - 144 mmol/L   Potassium 4.5  3.5 - 5.2 mmol/L   Chloride 104  97 - 108 mmol/L   CO2 28  18 - 29 mmol/L   Calcium 10.0  8.7 - 10.2 mg/dL   Total Protein 6.6  6.0 - 8.5 g/dL   Albumin 4.2  3.5 - 5.5 g/dL   Globulin, Total 2.4  1.5 - 4.5 g/dL   Albumin/Globulin Ratio 1.8  1.1 - 2.5   Total Bilirubin 0.3  0.0 - 1.2 mg/dL   Alkaline Phosphatase 120 (*) 39 - 117 IU/L   AST 22  0 - 40 IU/L   ALT 25  0 - 32 IU/L  NMR, LIPOPROFILE      Result Value Range   LDL Particle Number 689  <1000 nmol/L   LDLC SERPL CALC-MCNC 68  <100 mg/dL   HDL Cholesterol by NMR 75   >=40 mg/dL   Triglycerides by NMR 43  <150 mg/dL   Cholesterol 152  <200 mg/dL   HDL Particle Number 41.7  >=30.5 umol/L   Small LDL Particle Number < 90  <= 527 nmol/L   LDL Size 21.2  >20.5 nm   LP-IR Score < 25  <= 45  VITAMIN D 25 HYDROXY      Result Value Range   Vit D, 25-Hydroxy 37.8  30.0 - 100.0 ng/mL  POCT GLYCOSYLATED HEMOGLOBIN (HGB A1C)      Result Value Range   Hemoglobin A1C 5.4       ASSESSMENT: Hypertension - Plan: CMP14+EGFR  Hyperlipidemia - Plan: CMP14+EGFR, NMR, lipoprofile  Vitamin D deficiency - Plan: Vit D  25 hydroxy (rtn osteoporosis monitoring)  Multiple skin nodules  GERD (gastroesophageal reflux disease)  PLAN: GERD precautions.discussed Ears irrigated to clear       Dr Francis Wong's Recommendations  For nutrition information, I recommend books:  1).Eat to Live by Dr Joel Fuhrman. 2).Prevent and Reverse Heart Disease by Dr Caldwell Esselstyn. 3) Dr Neal Barnard's Book:  Program to Reverse Diabetes  Exercise recommendations are:  If unable to walk, then the patient can exercise in a chair 3 times a day. By flapping arms like a bird gently and raising legs outwards to the front.  If ambulatory, the patient can go for walks for 30 minutes 3 times a week. Then increase the intensity and duration as tolerated.  Goal is to try to attain exercise frequency to 5 times a week.  If applicable: Best to perform resistance exercises (machines or weights) 2 days a week and cardio type exercises 3 days per week.  GERD instructions in the AVS.   Dietary changes discussed.  Orders Placed This Encounter  Procedures  . CMP14+EGFR  . NMR, lipoprofile  . Vit D  25 hydroxy (rtn osteoporosis monitoring)   No orders of the defined types were placed in this encounter.   There are no discontinued medications. Return in about 4 months (around 08/20/2013) for Recheck medical problems.  Francis P. Wong, M.D.      

## 2013-04-22 NOTE — Patient Instructions (Signed)
Dr Paula Libra Recommendations  For nutrition information, I recommend books:  1).Eat to Live by Dr Excell Seltzer. 2).Prevent and Reverse Heart Disease by Dr Karl Luke. 3) Dr Janene Harvey Book:  Program to Reverse Diabetes  Exercise recommendations are:  If unable to walk, then the patient can exercise in a chair 3 times a day. By flapping arms like a bird gently and raising legs outwards to the front.  If ambulatory, the patient can go for walks for 30 minutes 3 times a week. Then increase the intensity and duration as tolerated.  Goal is to try to attain exercise frequency to 5 times a week.  If applicable: Best to perform resistance exercises (machines or weights) 2 days a week and cardio type exercises 3 days per week.    Gastroesophageal Reflux Disease, Adult Gastroesophageal reflux disease (GERD) happens when acid from your stomach flows up into the esophagus. When acid comes in contact with the esophagus, the acid causes soreness (inflammation) in the esophagus. Over time, GERD may create small holes (ulcers) in the lining of the esophagus. CAUSES   Increased body weight. This puts pressure on the stomach, making acid rise from the stomach into the esophagus.  Smoking. This increases acid production in the stomach.  Drinking alcohol. This causes decreased pressure in the lower esophageal sphincter (valve or ring of muscle between the esophagus and stomach), allowing acid from the stomach into the esophagus.  Late evening meals and a full stomach. This increases pressure and acid production in the stomach.  A malformed lower esophageal sphincter. Sometimes, no cause is found. SYMPTOMS   Burning pain in the lower part of the mid-chest behind the breastbone and in the mid-stomach area. This may occur twice a week or more often.  Trouble swallowing.  Sore throat.  Dry cough.  Asthma-like symptoms including chest tightness, shortness of breath, or  wheezing. DIAGNOSIS  Your caregiver may be able to diagnose GERD based on your symptoms. In some cases, X-rays and other tests may be done to check for complications or to check the condition of your stomach and esophagus. TREATMENT  Your caregiver may recommend over-the-counter or prescription medicines to help decrease acid production. Ask your caregiver before starting or adding any new medicines.  HOME CARE INSTRUCTIONS   Change the factors that you can control. Ask your caregiver for guidance concerning weight loss, quitting smoking, and alcohol consumption.  Avoid foods and drinks that make your symptoms worse, such as:  Caffeine or alcoholic drinks.  Chocolate.  Peppermint or mint flavorings.  Garlic and onions.  Spicy foods.  Citrus fruits, such as oranges, lemons, or limes.  Tomato-based foods such as sauce, chili, salsa, and pizza.  Fried and fatty foods.  Avoid lying down for the 3 hours prior to your bedtime or prior to taking a nap.  Eat small, frequent meals instead of large meals.  Wear loose-fitting clothing. Do not wear anything tight around your waist that causes pressure on your stomach.  Raise the head of your bed 6 to 8 inches with wood blocks to help you sleep. Extra pillows will not help.  Only take over-the-counter or prescription medicines for pain, discomfort, or fever as directed by your caregiver.  Do not take aspirin, ibuprofen, or other nonsteroidal anti-inflammatory drugs (NSAIDs). SEEK IMMEDIATE MEDICAL CARE IF:   You have pain in your arms, neck, jaw, teeth, or back.  Your pain increases or changes in intensity or duration.  You develop  nausea, vomiting, or sweating (diaphoresis).  You develop shortness of breath, or you faint.  Your vomit is green, yellow, black, or looks like coffee grounds or blood.  Your stool is red, bloody, or black. These symptoms could be signs of other problems, such as heart disease, gastric bleeding, or  esophageal bleeding. MAKE SURE YOU:   Understand these instructions.  Will watch your condition.  Will get help right away if you are not doing well or get worse. Document Released: 12/18/2004 Document Revised: 06/02/2011 Document Reviewed: 09/27/2010 [facility] Patient Information 2014 [place], [place].

## 2013-04-24 LAB — NMR, LIPOPROFILE
Cholesterol: 172 mg/dL
HDL Cholesterol by NMR: 88 mg/dL
HDL Particle Number: 39.1 umol/L
LDL Particle Number: 545 nmol/L
LDL Size: 21.2 nm
LDLC SERPL CALC-MCNC: 75 mg/dL
LP-IR Score: <25
Small LDL Particle Number: <90 nmol/L
Triglycerides by NMR: 47 mg/dL

## 2013-04-24 LAB — CMP14+EGFR
ALT: 24 IU/L (ref 0–32)
AST: 25 IU/L (ref 0–40)
Albumin/Globulin Ratio: 1.8 (ref 1.1–2.5)
Albumin: 4.5 g/dL (ref 3.5–5.5)
Alkaline Phosphatase: 112 IU/L (ref 39–117)
BUN/Creatinine Ratio: 19 (ref 9–23)
BUN: 19 mg/dL (ref 6–24)
CO2: 23 mmol/L (ref 18–29)
Calcium: 10.2 mg/dL (ref 8.7–10.2)
Chloride: 103 mmol/L (ref 97–108)
Creatinine, Ser: 1.02 mg/dL — ABNORMAL HIGH (ref 0.57–1.00)
GFR calc Af Amer: 71 mL/min/1.73
GFR calc non Af Amer: 61 mL/min/1.73
Globulin, Total: 2.5 g/dL (ref 1.5–4.5)
Glucose: 105 mg/dL — ABNORMAL HIGH (ref 65–99)
Potassium: 4.8 mmol/L (ref 3.5–5.2)
Sodium: 142 mmol/L (ref 134–144)
Total Bilirubin: 0.4 mg/dL (ref 0.0–1.2)
Total Protein: 7 g/dL (ref 6.0–8.5)

## 2013-04-24 LAB — VITAMIN D 25 HYDROXY (VIT D DEFICIENCY, FRACTURES): Vit D, 25-Hydroxy: 47.6 ng/mL (ref 30.0–100.0)

## 2013-08-22 ENCOUNTER — Ambulatory Visit: Payer: BC Managed Care – PPO | Admitting: General Practice

## 2013-08-24 ENCOUNTER — Other Ambulatory Visit: Payer: Self-pay | Admitting: *Deleted

## 2013-08-24 DIAGNOSIS — I10 Essential (primary) hypertension: Secondary | ICD-10-CM

## 2013-08-24 DIAGNOSIS — E785 Hyperlipidemia, unspecified: Secondary | ICD-10-CM

## 2013-08-24 NOTE — Telephone Encounter (Signed)
Last ov 04/22/13. Last lipids 04/22/13.

## 2013-08-25 MED ORDER — METOPROLOL SUCCINATE ER 100 MG PO TB24: ORAL_TABLET | ORAL | Status: DC

## 2013-08-25 MED ORDER — ATORVASTATIN CALCIUM 40 MG PO TABS: 40.0000 mg | ORAL_TABLET | Freq: Every day | ORAL | Status: DC

## 2013-08-26 ENCOUNTER — Encounter (INDEPENDENT_AMBULATORY_CARE_PROVIDER_SITE_OTHER): Payer: Self-pay

## 2013-08-26 ENCOUNTER — Ambulatory Visit (INDEPENDENT_AMBULATORY_CARE_PROVIDER_SITE_OTHER): Payer: BC Managed Care – PPO | Admitting: Family

## 2013-08-26 ENCOUNTER — Encounter: Payer: Self-pay | Admitting: Family

## 2013-08-26 VITALS — BP 158/80 | HR 81 | Temp 97.0°F | Ht 62.0 in | Wt 171.2 lb

## 2013-08-26 DIAGNOSIS — I10 Essential (primary) hypertension: Secondary | ICD-10-CM

## 2013-08-26 DIAGNOSIS — E559 Vitamin D deficiency, unspecified: Secondary | ICD-10-CM

## 2013-08-26 DIAGNOSIS — E785 Hyperlipidemia, unspecified: Secondary | ICD-10-CM

## 2013-08-26 DIAGNOSIS — K219 Gastro-esophageal reflux disease without esophagitis: Secondary | ICD-10-CM

## 2013-08-26 MED ORDER — AMLODIPINE BESYLATE 10 MG PO TABS: ORAL_TABLET | ORAL | Status: DC

## 2013-08-26 MED ORDER — ATORVASTATIN CALCIUM 40 MG PO TABS: 40.0000 mg | ORAL_TABLET | Freq: Every day | ORAL | Status: DC

## 2013-08-26 MED ORDER — LISINOPRIL-HYDROCHLOROTHIAZIDE 20-12.5 MG PO TABS: 1.0000 | ORAL_TABLET | Freq: Two times a day (BID) | ORAL | Status: DC

## 2013-08-26 MED ORDER — OMEPRAZOLE 20 MG PO CPDR
20.0000 mg | DELAYED_RELEASE_CAPSULE | Freq: Every day | ORAL | Status: DC
Start: 2013-08-26 — End: 2014-09-16

## 2013-08-26 MED ORDER — METOPROLOL SUCCINATE ER 100 MG PO TB24: ORAL_TABLET | ORAL | Status: DC

## 2013-08-26 NOTE — Progress Notes (Signed)
Subjective:    Patient ID: Gina Murray, female    DOB: 03/24/1956, 58 y.o.   MRN: [id number]  Gastrophageal Reflux She complains of heartburn. She reports no abdominal pain, no belching, no coughing, no nausea or no sore throat. This is a new problem. The current episode started more than 1 month ago (January). The problem occurs frequently. The problem has been waxing and waning. The heartburn duration is several minutes. The heartburn is located in the substernum. The heartburn is of moderate intensity. The heartburn does not wake her from sleep. The symptoms are aggravated by certain foods. Pertinent negatives include no fatigue or muscle weakness. She has tried an antacid for the symptoms. The treatment provided mild relief.  Hyperlipidemia This is a chronic problem. The current episode started more than 1 year ago. The problem is controlled. Recent lipid tests were reviewed and are normal. Exacerbating diseases include obesity. She has no history of diabetes or hypothyroidism. Factors aggravating her hyperlipidemia include fatty foods. Pertinent negatives include no focal weakness, leg pain or shortness of breath. Current antihyperlipidemic treatment includes statins. The current treatment provides moderate improvement of lipids. Risk factors for coronary artery disease include dyslipidemia, hypertension, obesity and post-menopausal.  Hypertension This is a chronic problem. The current episode started more than 1 year ago. The problem has been waxing and waning since onset. The problem is uncontrolled. Pertinent negatives include no anxiety, headaches, palpitations, peripheral edema or shortness of breath. Risk factors for coronary artery disease include dyslipidemia, post-menopausal state and obesity. Past treatments include beta blockers, ACE inhibitors and calcium channel blockers. The current treatment provides mild improvement. There is no history of kidney disease, CAD/MI or a thyroid  problem. There is no history of sleep apnea.      Review of Systems  Constitutional: Negative for fatigue.  HENT: Negative.  Negative for sore throat.   Respiratory: Negative.  Negative for cough and shortness of breath.   Cardiovascular: Negative.  Negative for palpitations.  Gastrointestinal: Positive for heartburn. Negative for nausea and abdominal pain.  Genitourinary: Negative.   Musculoskeletal: Negative.  Negative for muscle weakness.  Neurological: Negative for focal weakness and headaches.  All other systems reviewed and are negative.      Objective:   Physical Exam  Vitals reviewed. Constitutional: She is oriented to person, place, and time. She appears well-developed and well-nourished. No distress.  HENT:  Head: Normocephalic and atraumatic.  Right Ear: External ear normal.  Mouth/Throat: Oropharynx is clear and moist.  Eyes: Pupils are equal, round, and reactive to light.  Neck: Normal range of motion. Neck supple. No thyromegaly present.  Cardiovascular: Normal rate, regular rhythm, normal heart sounds and intact distal pulses.   No murmur heard. Pulmonary/Chest: Effort normal and breath sounds normal. No respiratory distress. She has no wheezes.  Abdominal: Soft. Bowel sounds are normal. She exhibits no distension. There is no tenderness.  Musculoskeletal: Normal range of motion. She exhibits no edema and no tenderness.  Neurological: She is alert and oriented to person, place, and time. She has normal reflexes. No cranial nerve deficit.  Skin: Skin is warm and dry.  Psychiatric: She has a normal mood and affect. Her behavior is normal. Judgment and thought content normal.      BP 158/80  Pulse 81  Temp(Src) 97 F (36.1 C) (Oral)  Ht '5\' 2"'  (1.575 m)  Wt 171 lb 3.2 oz (77.656 kg)  BMI 31.31 kg/m2  LMP 08/13/2001     Assessment & Plan:  1. Hypertension - amLODipine (NORVASC) 10 MG tablet; TAKE ONE TABLET BY MOUTH ONE TIME DAILY  Dispense: 30 tablet;  Refill: 11  2. GERD (gastroesophageal reflux disease) - omeprazole (PRILOSEC) 20 MG capsule; Take 1 capsule (20 mg total) by mouth daily.  Dispense: 30 capsule; Refill: 11  3. Hyperlipidemia - Lipid panel  4. Vitamin D deficiency - Vit D  25 hydroxy (rtn osteoporosis monitoring)  5. HTN (hypertension) --Daily blood pressure log given with instructions on how to fill out and told to bring to next visit -Dash diet information given -Exercise encouraged - Stress Management  -Continue current meds- Added HTCZ to current dose of lisinopril - metoprolol succinate (TOPROL-XL) 100 MG 24 hr tablet; TAKE ONE TABLET BY MOUTH ONE TIME DAILY  Dispense: 30 tablet; Refill: 6 - lisinopril-hydrochlorothiazide (PRINZIDE,ZESTORETIC) 20-12.5 MG per tablet; Take 1 tablet by mouth 2 (two) times daily.  Dispense: 60 tablet; Refill: 6 - CMP14+EGFR  6. HLD (hyperlipidemia) - atorvastatin (LIPITOR) 40 MG tablet; Take 1 tablet (40 mg total) by mouth daily.  Dispense: 30 tablet; Refill: 6   Continue all meds Labs pending Health Maintenance reviewed Diet and exercise encouraged RTO 6 months  Evelina Dun, FNP

## 2013-08-26 NOTE — Patient Instructions (Signed)

## 2013-08-27 LAB — CMP14+EGFR
ALT: 25 IU/L (ref 0–32)
AST: 24 IU/L (ref 0–40)
Albumin/Globulin Ratio: 2 (ref 1.1–2.5)
Albumin: 4.6 g/dL (ref 3.5–5.5)
Alkaline Phosphatase: 121 IU/L — ABNORMAL HIGH (ref 39–117)
BUN/Creatinine Ratio: 13 (ref 9–23)
BUN: 12 mg/dL (ref 6–24)
CO2: 24 mmol/L (ref 18–29)
Calcium: 10.3 mg/dL — ABNORMAL HIGH (ref 8.7–10.2)
Chloride: 101 mmol/L (ref 97–108)
Creatinine, Ser: 0.9 mg/dL (ref 0.57–1.00)
GFR calc Af Amer: 82 mL/min/{1.73_m2} (ref >59)
GFR calc non Af Amer: 71 mL/min/{1.73_m2} (ref >59)
Globulin, Total: 2.3 g/dL (ref 1.5–4.5)
Glucose: 108 mg/dL — ABNORMAL HIGH (ref 65–99)
Potassium: 4.7 mmol/L (ref 3.5–5.2)
Sodium: 142 mmol/L (ref 134–144)
Total Bilirubin: 0.3 mg/dL (ref 0.0–1.2)
Total Protein: 6.9 g/dL (ref 6.0–8.5)

## 2013-08-27 LAB — LIPID PANEL
Chol/HDL Ratio: 2.1 ratio units (ref 0.0–4.4)
Cholesterol, Total: 182 mg/dL (ref 100–199)
HDL: 88 mg/dL (ref >39)
LDL Calculated: 82 mg/dL (ref 0–99)
Triglycerides: 60 mg/dL (ref 0–149)
VLDL Cholesterol Cal: 12 mg/dL (ref 5–40)

## 2013-08-27 LAB — VITAMIN D 25 HYDROXY (VIT D DEFICIENCY, FRACTURES): Vit D, 25-Hydroxy: 48.8 ng/mL (ref 30.0–100.0)

## 2013-08-29 ENCOUNTER — Telehealth: Payer: Self-pay | Admitting: Family Medicine

## 2013-08-29 NOTE — Telephone Encounter (Signed)
Message copied by Waverly Ferrari on Mon Aug 29, 2013  9:16 AM ------      Message from: Lenna Gilford, [place] A      Created: Mon Aug 29, 2013  8:57 AM       Kidney and liver function stable      Cholesterol levels WNL- Encourage low fat diet and exercise      Vit D levels WNL ------

## 2014-01-07 ENCOUNTER — Ambulatory Visit (INDEPENDENT_AMBULATORY_CARE_PROVIDER_SITE_OTHER): Payer: BC Managed Care – PPO

## 2014-01-07 ENCOUNTER — Other Ambulatory Visit: Payer: Self-pay | Admitting: *Deleted

## 2014-01-07 DIAGNOSIS — Z23 Encounter for immunization: Secondary | ICD-10-CM

## 2014-01-17 ENCOUNTER — Encounter (HOSPITAL_COMMUNITY): Payer: Self-pay | Admitting: Pharmacy Technician

## 2014-01-25 ENCOUNTER — Encounter (HOSPITAL_COMMUNITY)
Admission: RE | Admit: 2014-01-25 | Discharge: 2014-01-25 | Disposition: A | Discharge status: Home / Self Care | Payer: BC Managed Care – PPO | Source: Ambulatory Visit | Attending: Ophthalmology | Admitting: Ophthalmology

## 2014-01-25 ENCOUNTER — Encounter (HOSPITAL_COMMUNITY): Payer: Self-pay

## 2014-01-25 ENCOUNTER — Other Ambulatory Visit: Payer: Self-pay

## 2014-01-25 DIAGNOSIS — Z01818 Encounter for other preprocedural examination: Secondary | ICD-10-CM | POA: Diagnosis present

## 2014-01-25 LAB — BASIC METABOLIC PANEL
Anion gap: 14 (ref 5–15)
BUN: 22 mg/dL (ref 6–23)
CO2: 27 mEq/L (ref 19–32)
Calcium: 9.8 mg/dL (ref 8.4–10.5)
Chloride: 99 mEq/L (ref 96–112)
Creatinine, Ser: 1.15 mg/dL — ABNORMAL HIGH (ref 0.50–1.10)
GFR calc Af Amer: 60 mL/min — ABNORMAL LOW (ref >90)
GFR calc non Af Amer: 51 mL/min — ABNORMAL LOW (ref >90)
Glucose, Bld: 101 mg/dL — ABNORMAL HIGH (ref 70–99)
Potassium: 4.1 mEq/L (ref 3.7–5.3)
Sodium: 140 mEq/L (ref 137–147)

## 2014-01-25 LAB — HEMOGLOBIN AND HEMATOCRIT, BLOOD
HCT: 35.8 % — ABNORMAL LOW (ref 36.0–46.0)
Hemoglobin: 12 g/dL (ref 12.0–15.0)

## 2014-01-25 NOTE — Patient Instructions (Signed)
Gina Murray  01/25/2014   Your procedure is scheduled on:  01/30/14  Report to [facility] at 0930 AM.  Call this number if you have problems the morning of surgery: [phone]   Remember:   Do not eat food or drink liquids after midnight.   Take these medicines the morning of surgery with A SIP OF WATER: amlodipine, lisinopril, metoprolol, prilosec   Do not wear jewelry, make-up or nail polish.  Do not wear lotions, powders, or perfumes. You may wear deodorant.  Do not shave 48 hours prior to surgery. Men may shave face and neck.  Do not bring valuables to the hospital.  [facility] is not responsible                  for any belongings or valuables.               Contacts, dentures or bridgework may not be worn into surgery.  Leave suitcase in the car. After surgery it may be brought to your room.  For patients admitted to the hospital, discharge time is determined by your                treatment team.               Patients discharged the day of surgery will not be allowed to drive  home.  Name and phone number of your driver: family  Special Instructions: N/A   Please read over the following fact sheets that you were given: Anesthesia Post-op Instructions and Care and Recovery After Surgery    PATIENT INSTRUCTIONS POST-ANESTHESIA  IMMEDIATELY FOLLOWING SURGERY:  Do not drive or operate machinery for the first twenty four hours after surgery.  Do not make any important decisions for twenty four hours after surgery or while taking narcotic pain medications or sedatives.  If you develop intractable nausea and vomiting or a severe headache please notify your doctor immediately.  FOLLOW-UP:  Please make an appointment with your surgeon as instructed. You do not need to follow up with anesthesia unless specifically instructed to do so.  WOUND CARE INSTRUCTIONS (if applicable):  Keep a dry clean dressing on the anesthesia/puncture wound site if there is drainage.  Once the wound  has quit draining you may leave it open to air.  Generally you should leave the bandage intact for twenty four hours unless there is drainage.  If the epidural site drains for more than 36-48 hours please call the anesthesia department.  QUESTIONS?:  Please feel free to call your physician or the hospital operator if you have any questions, and they will be happy to assist you.      Cataract A cataract is a clouding of the lens of the eye. When a lens becomes cloudy, vision is reduced based on the degree and nature of the clouding. Many cataracts reduce vision to some degree. Some cataracts make people more near-sighted as they develop. Other cataracts increase glare. Cataracts that are ignored and become worse can sometimes look white. The white color can be seen through the pupil. CAUSES   Aging. However, cataracts may occur at any age, even in newborns.  Certain drugs.  Trauma to the eye.  Certain diseases such as diabetes.  Specific eye diseases such as chronic inflammation inside the eye or a sudden attack of a rare form of glaucoma.  Inherited or acquired medical problems. SYMPTOMS   Gradual, progressive drop in vision in the affected eye.  Severe,  rapid visual loss. This most often happens when trauma is the cause. DIAGNOSIS  To detect a cataract, an eye doctor examines the lens. Cataracts are best diagnosed with an exam of the eyes with the pupils enlarged (dilated) by drops.  TREATMENT  For an early cataract, vision may improve by using different eyeglasses or stronger lighting. If that does not help your vision, surgery is the only effective treatment. A cataract needs to be surgically removed when vision loss interferes with your everyday activities, such as driving, reading, or watching TV. A cataract may also have to be removed if it prevents examination or treatment of another eye problem. Surgery removes the cloudy lens and usually replaces it with a substitute lens  (intraocular lens, IOL).  At a time when both you and your doctor agree, the cataract will be surgically removed. If you have cataracts in both eyes, only one is usually removed at a time. This allows the operated eye to heal and be out of danger from any possible problems after surgery (such as infection or poor wound healing). In rare cases, a cataract may be doing damage to your eye. In these cases, your caregiver may advise surgical removal right away. The vast majority of people who have cataract surgery have better vision afterward. HOME CARE INSTRUCTIONS  If you are not planning surgery, you may be asked to do the following:  Use different eyeglasses.  Use stronger or brighter lighting.  Ask your eye doctor about reducing your medicine dose or changing medicines if it is thought that a medicine caused your cataract. Changing medicines does not make the cataract go away on its own.  Become familiar with your surroundings. Poor vision can lead to injury. Avoid bumping into things on the affected side. You are at a higher risk for tripping or falling.  Exercise extreme care when driving or operating machinery.  Wear sunglasses if you are sensitive to bright light or experiencing problems with glare. SEEK IMMEDIATE MEDICAL CARE IF:   You have a worsening or sudden vision loss.  You notice redness, swelling, or increasing pain in the eye.  You have a fever. Document Released: 03/10/2005 Document Revised: 06/02/2011 Document Reviewed: 11/01/2010 [facility] Patient Information 2015 [place], [place]. This information is not intended to replace advice given to you by your health care provider. Make sure you discuss any questions you have with your health care provider.

## 2014-01-25 NOTE — Pre-Procedure Instructions (Signed)
Pt. Given info on MyChart. To be setup at home.

## 2014-01-27 MED ORDER — CYCLOPENTOLATE-PHENYLEPHRINE OP SOLN OPTIME - NO CHARGE
OPHTHALMIC | Status: AC
  Filled 2014-01-27: qty 2

## 2014-01-27 MED ORDER — LIDOCAINE HCL 3.5 % OP GEL
OPHTHALMIC | Status: AC
  Filled 2014-01-27: qty 1

## 2014-01-27 MED ORDER — TETRACAINE HCL 0.5 % OP SOLN
OPHTHALMIC | Status: AC
  Filled 2014-01-27: qty 2

## 2014-01-27 MED ORDER — PHENYLEPHRINE HCL 2.5 % OP SOLN
OPHTHALMIC | Status: AC
  Filled 2014-01-27: qty 15

## 2014-01-27 MED ORDER — LIDOCAINE HCL (PF) 1 % IJ SOLN
INTRAMUSCULAR | Status: AC
  Filled 2014-01-27: qty 2

## 2014-01-27 MED ORDER — NEOMYCIN-POLYMYXIN-DEXAMETH 3.5-10000-0.1 OP SUSP
OPHTHALMIC | Status: AC
  Filled 2014-01-27: qty 5

## 2014-01-30 ENCOUNTER — Ambulatory Visit (HOSPITAL_COMMUNITY)
Admission: RE | Admit: 2014-01-30 | Discharge: 2014-01-30 | Disposition: A | Discharge status: Home / Self Care | Payer: BC Managed Care – PPO | Source: Ambulatory Visit | Attending: Ophthalmology | Admitting: Ophthalmology

## 2014-01-30 ENCOUNTER — Encounter (HOSPITAL_COMMUNITY)
Admission: RE | Disposition: A | Discharge status: Home / Self Care | Payer: Self-pay | Source: Ambulatory Visit | Attending: Ophthalmology

## 2014-01-30 ENCOUNTER — Encounter (HOSPITAL_COMMUNITY): Payer: Self-pay | Admitting: *Deleted

## 2014-01-30 ENCOUNTER — Ambulatory Visit (HOSPITAL_COMMUNITY): Payer: BC Managed Care – PPO | Admitting: Anesthesiology

## 2014-01-30 DIAGNOSIS — E785 Hyperlipidemia, unspecified: Secondary | ICD-10-CM | POA: Diagnosis not present

## 2014-01-30 DIAGNOSIS — H2511 Age-related nuclear cataract, right eye: Secondary | ICD-10-CM | POA: Insufficient documentation

## 2014-01-30 DIAGNOSIS — E559 Vitamin D deficiency, unspecified: Secondary | ICD-10-CM | POA: Diagnosis not present

## 2014-01-30 DIAGNOSIS — K219 Gastro-esophageal reflux disease without esophagitis: Secondary | ICD-10-CM | POA: Diagnosis not present

## 2014-01-30 DIAGNOSIS — I10 Essential (primary) hypertension: Secondary | ICD-10-CM | POA: Insufficient documentation

## 2014-01-30 HISTORY — PX: CATARACT EXTRACTION W/PHACO: SHX586

## 2014-01-30 SURGERY — PHACOEMULSIFICATION, CATARACT, WITH IOL INSERTION
Anesthesia: Monitor Anesthesia Care | Site: Eye | Laterality: Right

## 2014-01-30 MED ORDER — EPINEPHRINE HCL 1 MG/ML IJ SOLN
INTRAMUSCULAR | Status: AC
  Filled 2014-01-30: qty 1

## 2014-01-30 MED ORDER — CYCLOPENTOLATE-PHENYLEPHRINE 0.2-1 % OP SOLN
1.0000 [drp] | OPHTHALMIC | Status: AC
  Administered 2014-01-30 (×3): 1 [drp] via OPHTHALMIC

## 2014-01-30 MED ORDER — NEOMYCIN-POLYMYXIN-DEXAMETH 3.5-10000-0.1 OP SUSP
OPHTHALMIC | Status: DC | PRN
  Administered 2014-01-30: 2 [drp] via OPHTHALMIC

## 2014-01-30 MED ORDER — EPINEPHRINE HCL 1 MG/ML IJ SOLN
INTRAOCULAR | Status: DC | PRN
  Administered 2014-01-30: 500 mL

## 2014-01-30 MED ORDER — FENTANYL CITRATE 0.05 MG/ML IJ SOLN
INTRAMUSCULAR | Status: AC
  Filled 2014-01-30: qty 2

## 2014-01-30 MED ORDER — MIDAZOLAM HCL 2 MG/2ML IJ SOLN
1.0000 mg | INTRAMUSCULAR | Status: DC | PRN
  Administered 2014-01-30 (×2): 2 mg via INTRAVENOUS

## 2014-01-30 MED ORDER — TETRACAINE HCL 0.5 % OP SOLN
1.0000 [drp] | OPHTHALMIC | Status: AC
  Administered 2014-01-30 (×3): 1 [drp] via OPHTHALMIC

## 2014-01-30 MED ORDER — PHENYLEPHRINE HCL 2.5 % OP SOLN
1.0000 [drp] | OPHTHALMIC | Status: AC
  Administered 2014-01-30 (×3): 1 [drp] via OPHTHALMIC

## 2014-01-30 MED ORDER — BSS IO SOLN
INTRAOCULAR | Status: DC | PRN
  Administered 2014-01-30: 15 mL

## 2014-01-30 MED ORDER — PROVISC 10 MG/ML IO SOLN
INTRAOCULAR | Status: DC | PRN
  Administered 2014-01-30: 0.85 mL via INTRAOCULAR

## 2014-01-30 MED ORDER — POVIDONE-IODINE 5 % OP SOLN
OPHTHALMIC | Status: DC | PRN
  Administered 2014-01-30: 1 via OPHTHALMIC

## 2014-01-30 MED ORDER — LIDOCAINE 3.5 % OP GEL OPTIME - NO CHARGE
OPHTHALMIC | Status: DC | PRN
  Administered 2014-01-30: 1 [drp] via OPHTHALMIC

## 2014-01-30 MED ORDER — LACTATED RINGERS IV SOLN
INTRAVENOUS | Status: DC
  Administered 2014-01-30: 1000 mL via INTRAVENOUS

## 2014-01-30 MED ORDER — LIDOCAINE HCL 3.5 % OP GEL
1.0000 "application " | Freq: Once | OPHTHALMIC | Status: AC
  Administered 2014-01-30: 1 via OPHTHALMIC

## 2014-01-30 MED ORDER — MIDAZOLAM HCL 2 MG/2ML IJ SOLN
INTRAMUSCULAR | Status: AC
  Filled 2014-01-30: qty 2

## 2014-01-30 MED ORDER — FENTANYL CITRATE 0.05 MG/ML IJ SOLN
25.0000 ug | INTRAMUSCULAR | Status: AC
  Administered 2014-01-30 (×2): 25 ug via INTRAVENOUS

## 2014-01-30 MED ORDER — LIDOCAINE HCL (PF) 1 % IJ SOLN
INTRAMUSCULAR | Status: DC | PRN
  Administered 2014-01-30: .6 mL

## 2014-01-30 SURGICAL SUPPLY — 33 items
CAPSULAR TENSION RING-AMO (OPHTHALMIC RELATED) IMPLANT
CLOTH BEACON ORANGE TIMEOUT ST (SAFETY) ×2 IMPLANT
EYE SHIELD UNIVERSAL CLEAR (GAUZE/BANDAGES/DRESSINGS) ×2 IMPLANT
GLOVE BIO SURGEON STRL SZ 6.5 (GLOVE) IMPLANT
GLOVE BIOGEL PI IND STRL 6.5 (GLOVE) IMPLANT
GLOVE BIOGEL PI IND STRL 7.0 (GLOVE) ×3 IMPLANT
GLOVE BIOGEL PI IND STRL 7.5 (GLOVE) IMPLANT
GLOVE BIOGEL PI INDICATOR 6.5 (GLOVE)
GLOVE BIOGEL PI INDICATOR 7.0 (GLOVE) ×3
GLOVE BIOGEL PI INDICATOR 7.5 (GLOVE)
GLOVE ECLIPSE 6.5 STRL STRAW (GLOVE) IMPLANT
GLOVE ECLIPSE 7.0 STRL STRAW (GLOVE) IMPLANT
GLOVE ECLIPSE 7.5 STRL STRAW (GLOVE) IMPLANT
GLOVE EXAM NITRILE LRG STRL (GLOVE) IMPLANT
GLOVE EXAM NITRILE MD LF STRL (GLOVE) IMPLANT
GLOVE SKINSENSE NS SZ6.5 (GLOVE)
GLOVE SKINSENSE NS SZ7.0 (GLOVE)
GLOVE SKINSENSE STRL SZ6.5 (GLOVE) IMPLANT
GLOVE SKINSENSE STRL SZ7.0 (GLOVE) IMPLANT
KIT VITRECTOMY (OPHTHALMIC RELATED) IMPLANT
PAD ARMBOARD 7.5X6 YLW CONV (MISCELLANEOUS) ×2 IMPLANT
PROC W NO LENS (INTRAOCULAR LENS)
PROC W SPEC LENS (INTRAOCULAR LENS)
PROCESS W NO LENS (INTRAOCULAR LENS) IMPLANT
PROCESS W SPEC LENS (INTRAOCULAR LENS) IMPLANT
RETRACTOR IRIS SIGHTPATH (OPHTHALMIC RELATED) IMPLANT
RING MALYGIN (MISCELLANEOUS) IMPLANT
SIGHTPATH CAT PROC W REG LENS (Ophthalmic Related) ×2 IMPLANT
SYRINGE LUER LOK 1CC (MISCELLANEOUS) ×2 IMPLANT
TAPE SURG TRANSPORE 1 IN (GAUZE/BANDAGES/DRESSINGS) ×1 IMPLANT
TAPE SURGICAL TRANSPORE 1 IN (GAUZE/BANDAGES/DRESSINGS) ×1
VISCOELASTIC ADDITIONAL (OPHTHALMIC RELATED) IMPLANT
WATER STERILE IRR 250ML POUR (IV SOLUTION) ×2 IMPLANT

## 2014-01-30 NOTE — Anesthesia Preprocedure Evaluation (Signed)
Anesthesia Evaluation  Patient identified by MRN, date of birth, ID band Patient awake    Reviewed: Allergy & Precautions, H&P , NPO status , Patient's Chart, lab work & pertinent test results, reviewed documented beta blocker date and time   Airway Mallampati: II  TM Distance: >3 FB     Dental  (+) Teeth Intact   Pulmonary neg pulmonary ROS,  breath sounds clear to auscultation        Cardiovascular hypertension, Pt. on medications and Pt. on home beta blockers Rhythm:Regular Rate:Normal     Neuro/Psych    GI/Hepatic GERD-  ,  Endo/Other    Renal/GU      Musculoskeletal   Abdominal   Peds  Hematology   Anesthesia Other Findings   Reproductive/Obstetrics                             Anesthesia Physical Anesthesia Plan  ASA: II  Anesthesia Plan: MAC   Post-op Pain Management:    Induction: Intravenous  Airway Management Planned: Nasal Cannula  Additional Equipment:   Intra-op Plan:   Post-operative Plan:   Informed Consent: I have reviewed the patients History and Physical, chart, labs and discussed the procedure including the risks, benefits and alternatives for the proposed anesthesia with the patient or authorized representative who has indicated his/her understanding and acceptance.     Plan Discussed with:   Anesthesia Plan Comments:         Anesthesia Quick Evaluation

## 2014-01-30 NOTE — Op Note (Signed)
Date of Admission: 01/30/2014  Date of Surgery: 01/30/2014   Pre-Op Dx: Cataract Right Eye  Post-Op Dx: Senile Nuclear Cataract Right  Eye,  Dx Code H25.11  Surgeon: Tonny Branch, M.D.  Assistants: None  Anesthesia: Topical with MAC  Indications: Painless, progressive loss of vision with compromise of daily activities.  Surgery: Cataract Extraction with Intraocular lens Implant Right Eye  Discription: The patient had dilating drops and viscous lidocaine placed into the Right eye in the pre-op holding area. After transfer to the operating room, a time out was performed. The patient was then prepped and draped. Beginning with a 79 degree blade a paracentesis port was made at the surgeon's 2 o'clock position. The anterior chamber was then filled with 1% non-preserved lidocaine. This was followed by filling the anterior chamber with Provisc.  A 2.18mm keratome blade was used to make a clear corneal incision at the temporal limbus.  A bent cystatome needle was used to create a continuous tear capsulotomy. Hydrodissection was performed with balanced salt solution on a Fine canula. The lens nucleus was then removed using the phacoemulsification handpiece. Residual cortex was removed with the I&A handpiece. The anterior chamber and capsular bag were refilled with Provisc. A posterior chamber intraocular lens was placed into the capsular bag with it's injector. The implant was positioned with the Kuglan hook. The Provisc was then removed from the anterior chamber and capsular bag with the I&A handpiece. Stromal hydration of the main incision and paracentesis port was performed with BSS on a Fine canula. The wounds were tested for leak which was negative. The patient tolerated the procedure well. There were no operative complications. The patient was then transferred to the recovery room in stable condition.  Complications: None  Specimen: None  EBL: None  Prosthetic device: Hoya iSert 250, power 19.0 D,  SN [id number].

## 2014-01-30 NOTE — Anesthesia Postprocedure Evaluation (Signed)
  Anesthesia Post-op Note  Patient: Gina Murray  Procedure(s) Performed: Procedure(s) with comments: CATARACT EXTRACTION PHACO AND INTRAOCULAR LENS PLACEMENT (IOC) (Right) - CDE 13.53  Patient Location: Short Stay  Anesthesia Type:MAC  Level of Consciousness: awake, alert  and oriented  Airway and Oxygen Therapy: Patient Spontanous Breathing  Post-op Pain: none  Post-op Assessment: Post-op Vital signs reviewed, Patient's Cardiovascular Status Stable, Respiratory Function Stable, Patent Airway and No signs of Nausea or vomiting  Post-op Vital Signs: Reviewed and stable  Last Vitals:  Filed Vitals:   01/30/14 1030  BP: 123/63  Temp:   Resp: 21    Complications: No apparent anesthesia complications

## 2014-01-30 NOTE — Discharge Instructions (Signed)

## 2014-01-30 NOTE — H&P (Signed)
I have reviewed the H&P, the patient was re-examined, and I have identified no interval changes in medical condition and plan of care since the history and physical of record  

## 2014-01-30 NOTE — Transfer of Care (Signed)
Immediate Anesthesia Transfer of Care Note  Patient: Gina Murray  Procedure(s) Performed: Procedure(s) with comments: CATARACT EXTRACTION PHACO AND INTRAOCULAR LENS PLACEMENT (IOC) (Right) - CDE 13.53  Patient Location: Short Stay  Anesthesia Type:MAC  Level of Consciousness: awake  Airway & Oxygen Therapy: Patient Spontanous Breathing  Post-op Assessment: Report given to PACU RN  Post vital signs: Reviewed  Complications: No apparent anesthesia complications

## 2014-01-31 ENCOUNTER — Encounter (HOSPITAL_COMMUNITY): Payer: Self-pay | Admitting: Ophthalmology

## 2014-03-03 ENCOUNTER — Ambulatory Visit: Payer: BC Managed Care – PPO | Admitting: Family

## 2014-03-25 ENCOUNTER — Other Ambulatory Visit: Payer: Self-pay | Admitting: Family

## 2014-04-06 ENCOUNTER — Ambulatory Visit (INDEPENDENT_AMBULATORY_CARE_PROVIDER_SITE_OTHER): Payer: BLUE CROSS/BLUE SHIELD | Admitting: Family

## 2014-04-06 ENCOUNTER — Encounter: Payer: Self-pay | Admitting: Family

## 2014-04-06 VITALS — BP 116/66 | HR 70 | Temp 97.2°F | Ht 63.0 in | Wt 171.6 lb

## 2014-04-06 DIAGNOSIS — E785 Hyperlipidemia, unspecified: Secondary | ICD-10-CM

## 2014-04-06 DIAGNOSIS — I10 Essential (primary) hypertension: Secondary | ICD-10-CM

## 2014-04-06 DIAGNOSIS — K219 Gastro-esophageal reflux disease without esophagitis: Secondary | ICD-10-CM

## 2014-04-06 DIAGNOSIS — E559 Vitamin D deficiency, unspecified: Secondary | ICD-10-CM

## 2014-04-06 MED ORDER — LISINOPRIL-HYDROCHLOROTHIAZIDE 20-12.5 MG PO TABS: ORAL_TABLET | ORAL | Status: DC

## 2014-04-06 NOTE — Patient Instructions (Signed)

## 2014-04-06 NOTE — Progress Notes (Signed)
Subjective:    Patient ID: Gina Murray, female    DOB: 02-13-1956, 59 y.o.   MRN: [id number]  Hypertension This is a chronic problem. The current episode started more than 1 year ago. The problem has been resolved since onset. The problem is controlled. Pertinent negatives include no anxiety, headaches, palpitations, peripheral edema or shortness of breath. Risk factors for coronary artery disease include dyslipidemia, post-menopausal state and obesity. Past treatments include beta blockers, ACE inhibitors, calcium channel blockers and diuretics. The current treatment provides mild improvement. There is no history of kidney disease, CAD/MI, CVA, heart failure or a thyroid problem. There is no history of sleep apnea.  Hyperlipidemia This is a chronic problem. The current episode started more than 1 year ago. The problem is controlled. Recent lipid tests were reviewed and are normal. Exacerbating diseases include obesity. She has no history of diabetes or hypothyroidism. Factors aggravating her hyperlipidemia include fatty foods. Pertinent negatives include no focal weakness, leg pain or shortness of breath. Current antihyperlipidemic treatment includes statins. The current treatment provides moderate improvement of lipids. Risk factors for coronary artery disease include dyslipidemia, hypertension, obesity and post-menopausal.  Gastrophageal Reflux She complains of heartburn. She reports no abdominal pain, no belching, no coughing, no nausea or no sore throat. This is a new problem. The current episode started more than 1 month ago (January). The problem occurs frequently. The problem has been waxing and waning. The heartburn duration is several minutes. The heartburn is located in the substernum. The heartburn is of moderate intensity. The heartburn does not wake her from sleep. The symptoms are aggravated by certain foods. Pertinent negatives include no fatigue or muscle weakness. She has tried an  antacid for the symptoms. The treatment provided mild relief.      Review of Systems  Constitutional: Negative.  Negative for fatigue.  HENT: Negative.  Negative for sore throat.   Eyes: Negative.   Respiratory: Negative.  Negative for cough and shortness of breath.   Cardiovascular: Negative.  Negative for palpitations.  Gastrointestinal: Positive for heartburn. Negative for nausea and abdominal pain.  Endocrine: Negative.   Genitourinary: Negative.   Musculoskeletal: Negative.  Negative for muscle weakness.  Neurological: Negative.  Negative for focal weakness and headaches.  Hematological: Negative.   Psychiatric/Behavioral: Negative.   All other systems reviewed and are negative.      Objective:   Physical Exam  Constitutional: She is oriented to person, place, and time. She appears well-developed and well-nourished. No distress.  HENT:  Head: Normocephalic and atraumatic.  Right Ear: External ear normal.  Left Ear: External ear normal.  Nose: Nose normal.  Mouth/Throat: Oropharynx is clear and moist.  Eyes: Pupils are equal, round, and reactive to light.  Neck: Normal range of motion. Neck supple. No thyromegaly present.  Cardiovascular: Normal rate, regular rhythm, normal heart sounds and intact distal pulses.   No murmur heard. Pulmonary/Chest: Effort normal and breath sounds normal. No respiratory distress. She has no wheezes.  Abdominal: Soft. Bowel sounds are normal. She exhibits no distension. There is no tenderness.  Musculoskeletal: Normal range of motion. She exhibits no edema or tenderness.  Neurological: She is alert and oriented to person, place, and time. She has normal reflexes. No cranial nerve deficit.  Skin: Skin is warm and dry.  Psychiatric: She has a normal mood and affect. Her behavior is normal. Judgment and thought content normal.  Vitals reviewed.     BP 116/66 mmHg  Pulse 70  Temp(Src) 97.2  F (36.2 C) (Oral)  Ht '5\' 3"'  (1.6 m)  Wt 171  lb 9.6 oz (77.837 kg)  BMI 30.41 kg/m2  LMP 08/13/2001     Assessment & Plan:  1. Essential hypertension - CMP14+EGFR - lisinopril-hydrochlorothiazide (PRINZIDE,ZESTORETIC) 20-12.5 MG per tablet; TAKE 1 TABLET BY MOUTH 2 (TWO) TIMES DAILY.  Dispense: 180 tablet; Refill: 3  2. Gastroesophageal reflux disease, esophagitis presence not specified - CMP14+EGFR  3. Vitamin D deficiency - CMP14+EGFR - Vit D  25 hydroxy (rtn osteoporosis monitoring)  4. Hyperlipidemia - CMP14+EGFR - Lipid panel   Continue all meds Labs pending Health Maintenance reviewed Diet and exercise encouraged RTO 6 months  Evelina Dun, FNP

## 2014-04-07 ENCOUNTER — Other Ambulatory Visit: Payer: Self-pay | Admitting: Family

## 2014-04-07 LAB — CMP14+EGFR
ALT: 23 IU/L (ref 0–32)
AST: 27 IU/L (ref 0–40)
Albumin/Globulin Ratio: 1.9 (ref 1.1–2.5)
Albumin: 4.5 g/dL (ref 3.5–5.5)
Alkaline Phosphatase: 107 IU/L (ref 39–117)
BUN/Creatinine Ratio: 21 (ref 9–23)
BUN: 22 mg/dL (ref 6–24)
CO2: 26 mmol/L (ref 18–29)
Calcium: 10.2 mg/dL (ref 8.7–10.2)
Chloride: 99 mmol/L (ref 97–108)
Creatinine, Ser: 1.05 mg/dL — ABNORMAL HIGH (ref 0.57–1.00)
GFR calc Af Amer: 68 mL/min/{1.73_m2} (ref >59)
GFR calc non Af Amer: 59 mL/min/{1.73_m2} — ABNORMAL LOW (ref >59)
Globulin, Total: 2.4 g/dL (ref 1.5–4.5)
Glucose: 106 mg/dL — ABNORMAL HIGH (ref 65–99)
Potassium: 4.5 mmol/L (ref 3.5–5.2)
Sodium: 142 mmol/L (ref 134–144)
Total Bilirubin: 0.5 mg/dL (ref 0.0–1.2)
Total Protein: 6.9 g/dL (ref 6.0–8.5)

## 2014-04-07 LAB — LIPID PANEL
Chol/HDL Ratio: 2.4 ratio units (ref 0.0–4.4)
Cholesterol, Total: 202 mg/dL — ABNORMAL HIGH (ref 100–199)
HDL: 83 mg/dL (ref >39)
LDL Calculated: 108 mg/dL — ABNORMAL HIGH (ref 0–99)
Triglycerides: 54 mg/dL (ref 0–149)
VLDL Cholesterol Cal: 11 mg/dL (ref 5–40)

## 2014-04-07 LAB — VITAMIN D 25 HYDROXY (VIT D DEFICIENCY, FRACTURES): Vit D, 25-Hydroxy: 44.5 ng/mL (ref 30.0–100.0)

## 2014-04-07 MED ORDER — ATORVASTATIN CALCIUM 80 MG PO TABS: 80.0000 mg | ORAL_TABLET | Freq: Every day | ORAL | Status: DC

## 2014-06-23 ENCOUNTER — Other Ambulatory Visit: Payer: Self-pay | Admitting: Family

## 2014-09-16 ENCOUNTER — Other Ambulatory Visit: Payer: Self-pay | Admitting: Family

## 2014-09-23 ENCOUNTER — Other Ambulatory Visit: Payer: Self-pay | Admitting: Family

## 2014-10-10 ENCOUNTER — Ambulatory Visit (INDEPENDENT_AMBULATORY_CARE_PROVIDER_SITE_OTHER): Payer: BLUE CROSS/BLUE SHIELD | Admitting: Family

## 2014-10-10 ENCOUNTER — Encounter: Payer: Self-pay | Admitting: Family

## 2014-10-10 VITALS — BP 128/76 | HR 60 | Temp 97.2°F | Ht 63.0 in | Wt 176.2 lb

## 2014-10-10 DIAGNOSIS — E785 Hyperlipidemia, unspecified: Secondary | ICD-10-CM

## 2014-10-10 DIAGNOSIS — K219 Gastro-esophageal reflux disease without esophagitis: Secondary | ICD-10-CM | POA: Diagnosis not present

## 2014-10-10 DIAGNOSIS — E559 Vitamin D deficiency, unspecified: Secondary | ICD-10-CM | POA: Diagnosis not present

## 2014-10-10 DIAGNOSIS — I10 Essential (primary) hypertension: Secondary | ICD-10-CM

## 2014-10-10 MED ORDER — OMEPRAZOLE 20 MG PO CPDR: DELAYED_RELEASE_CAPSULE | ORAL | Status: DC

## 2014-10-10 MED ORDER — AMLODIPINE BESYLATE 10 MG PO TABS: ORAL_TABLET | ORAL | Status: DC

## 2014-10-10 MED ORDER — METOPROLOL SUCCINATE ER 100 MG PO TB24: 100.0000 mg | ORAL_TABLET | Freq: Every day | ORAL | Status: DC

## 2014-10-10 NOTE — Patient Instructions (Addendum)
Health Maintenance Adopting a healthy lifestyle and getting preventive care can go a long way to promote health and wellness. Talk with your health care provider about what schedule of regular examinations is right for you. This is a good chance for you to check in with your provider about disease prevention and staying healthy. In between checkups, there are plenty of things you can do on your own. Experts have done a lot of research about which lifestyle changes and preventive measures are most likely to keep you healthy. Ask your health care provider for more information. WEIGHT AND DIET  Eat a healthy diet  Be sure to include plenty of vegetables, fruits, low-fat dairy products, and lean protein.  Do not eat a lot of foods high in solid fats, added sugars, or salt.  Get regular exercise. This is one of the most important things you can do for your health.  Most adults should exercise for at least 150 minutes each week. The exercise should increase your heart rate and make you sweat (moderate-intensity exercise).  Most adults should also do strengthening exercises at least twice a week. This is in addition to the moderate-intensity exercise.  Maintain a healthy weight  Body mass index (BMI) is a measurement that can be used to identify possible weight problems. It estimates body fat based on height and weight. Your health care provider can help determine your BMI and help you achieve or maintain a healthy weight.  For females 25 years of age and older:   A BMI below 18.5 is considered underweight.  A BMI of 18.5 to 24.9 is normal.  A BMI of 25 to 29.9 is considered overweight.  A BMI of 30 and above is considered obese.  Watch levels of cholesterol and blood lipids  You should start having your blood tested for lipids and cholesterol at 59 years of age, then have this test every 5 years.  You may need to have your cholesterol levels checked more often if:  Your lipid or  cholesterol levels are high.  You are older than 60 years of age.  You are at high risk for heart disease.  CANCER SCREENING   Lung Cancer  Lung cancer screening is recommended for adults 97-92 years old who are at high risk for lung cancer because of a history of smoking.  A yearly low-dose CT scan of the lungs is recommended for people who:  Currently smoke.  Have quit within the past 15 years.  Have at least a 30-pack-year history of smoking. A pack year is smoking an average of one pack of cigarettes a day for 1 year.  Yearly screening should continue until it has been 15 years since you quit.  Yearly screening should stop if you develop a health problem that would prevent you from having lung cancer treatment.  Breast Cancer  Practice breast self-awareness. This means understanding how your breasts normally appear and feel.  It also means doing regular breast self-exams. Let your health care provider know about any changes, no matter how small.  If you are in your 20s or 30s, you should have a clinical breast exam (CBE) by a health care provider every 1-3 years as part of a regular health exam.  If you are 76 or older, have a CBE every year. Also consider having a breast X-ray (mammogram) every year.  If you have a family history of breast cancer, talk to your health care provider about genetic screening.  If you are  at high risk for breast cancer, talk to your health care provider about having an MRI and a mammogram every year.  Breast cancer gene (BRCA) assessment is recommended for women who have family members with BRCA-related cancers. BRCA-related cancers include:  Breast.  Ovarian.  Tubal.  Peritoneal cancers.  Results of the assessment will determine the need for genetic counseling and BRCA1 and BRCA2 testing. Cervical Cancer Routine pelvic examinations to screen for cervical cancer are no longer recommended for nonpregnant women who are considered low  risk for cancer of the pelvic organs (ovaries, uterus, and vagina) and who do not have symptoms. A pelvic examination may be necessary if you have symptoms including those associated with pelvic infections. Ask your health care provider if a screening pelvic exam is right for you.   The Pap test is the screening test for cervical cancer for women who are considered at risk.  If you had a hysterectomy for a problem that was not cancer or a condition that could lead to cancer, then you no longer need Pap tests.  If you are older than 65 years, and you have had normal Pap tests for the past 10 years, you no longer need to have Pap tests.  If you have had past treatment for cervical cancer or a condition that could lead to cancer, you need Pap tests and screening for cancer for at least 20 years after your treatment.  If you no longer get a Pap test, assess your risk factors if they change (such as having a new sexual partner). This can affect whether you should start being screened again.  Some women have medical problems that increase their chance of getting cervical cancer. If this is the case for you, your health care provider may recommend more frequent screening and Pap tests.  The human papillomavirus (HPV) test is another test that may be used for cervical cancer screening. The HPV test looks for the virus that can cause cell changes in the cervix. The cells collected during the Pap test can be tested for HPV.  The HPV test can be used to screen women 30 years of age and older. Getting tested for HPV can extend the interval between normal Pap tests from three to five years.  An HPV test also should be used to screen women of any age who have unclear Pap test results.  After 59 years of age, women should have HPV testing as often as Pap tests.  Colorectal Cancer  This type of cancer can be detected and often prevented.  Routine colorectal cancer screening usually begins at 59 years of  age and continues through 59 years of age.  Your health care provider may recommend screening at an earlier age if you have risk factors for colon cancer.  Your health care provider may also recommend using home test kits to check for hidden blood in the stool.  A small camera at the end of a tube can be used to examine your colon directly (sigmoidoscopy or colonoscopy). This is done to check for the earliest forms of colorectal cancer.  Routine screening usually begins at age 50.  Direct examination of the colon should be repeated every 5-10 years through 59 years of age. However, you may need to be screened more often if early forms of precancerous polyps or small growths are found. Skin Cancer  Check your skin from head to toe regularly.  Tell your health care provider about any new moles or changes in   moles, especially if there is a change in a mole's shape or color.  Also tell your health care provider if you have a mole that is larger than the size of a pencil eraser.  Always use sunscreen. Apply sunscreen liberally and repeatedly throughout the day.  Protect yourself by wearing long sleeves, pants, a wide-brimmed hat, and sunglasses whenever you are outside. HEART DISEASE, DIABETES, AND HIGH BLOOD PRESSURE   Have your blood pressure checked at least every 1-2 years. High blood pressure causes heart disease and increases the risk of stroke.  If you are between 75 years and 42 years old, ask your health care provider if you should take aspirin to prevent strokes.  Have regular diabetes screenings. This involves taking a blood sample to check your fasting blood sugar level.  If you are at a normal weight and have a low risk for diabetes, have this test once every three years after 59 years of age.  If you are overweight and have a high risk for diabetes, consider being tested at a younger age or more often. PREVENTING INFECTION  Hepatitis B  If you have a higher risk for  hepatitis B, you should be screened for this virus. You are considered at high risk for hepatitis B if:  You were born in a country where hepatitis B is common. Ask your health care provider which countries are considered high risk.  Your parents were born in a high-risk country, and you have not been immunized against hepatitis B (hepatitis B vaccine).  You have HIV or AIDS.  You use needles to inject street drugs.  You live with someone who has hepatitis B.  You have had sex with someone who has hepatitis B.  You get hemodialysis treatment.  You take certain medicines for conditions, including cancer, organ transplantation, and autoimmune conditions. Hepatitis C  Blood testing is recommended for:  Everyone born from [birth year] through 1965.  Anyone with known risk factors for hepatitis C. Sexually transmitted infections (STIs)  You should be screened for sexually transmitted infections (STIs) including gonorrhea and chlamydia if:  You are sexually active and are younger than 59 years of age.  You are older than 59 years of age and your health care provider tells you that you are at risk for this type of infection.  Your sexual activity has changed since you were last screened and you are at an increased risk for chlamydia or gonorrhea. Ask your health care provider if you are at risk.  If you do not have HIV, but are at risk, it may be recommended that you take a prescription medicine daily to prevent HIV infection. This is called pre-exposure prophylaxis (PrEP). You are considered at risk if:  You are sexually active and do not regularly use condoms or know the HIV status of your partner(s).  You take drugs by injection.  You are sexually active with a partner who has HIV. Talk with your health care provider about whether you are at high risk of being infected with HIV. If you choose to begin PrEP, you should first be tested for HIV. You should then be tested every 3 months for  as long as you are taking PrEP.  PREGNANCY   If you are premenopausal and you may become pregnant, ask your health care provider about preconception counseling.  If you may become pregnant, take 400 to 800 micrograms (mcg) of folic acid every day.  If you want to prevent pregnancy, talk to your  health care provider about birth control (contraception). OSTEOPOROSIS AND MENOPAUSE   Osteoporosis is a disease in which the bones lose minerals and strength with aging. This can result in serious bone fractures. Your risk for osteoporosis can be identified using a bone density scan.  If you are 53 years of age or older, or if you are at risk for osteoporosis and fractures, ask your health care provider if you should be screened.  Ask your health care provider whether you should take a calcium or vitamin D supplement to lower your risk for osteoporosis.  Menopause may have certain physical symptoms and risks.  Hormone replacement therapy may reduce some of these symptoms and risks. Talk to your health care provider about whether hormone replacement therapy is right for you.  HOME CARE INSTRUCTIONS   Schedule regular health, dental, and eye exams.  Stay current with your immunizations.   Do not use any tobacco products including cigarettes, chewing tobacco, or electronic cigarettes.  If you are pregnant, do not drink alcohol.  If you are breastfeeding, limit how much and how often you drink alcohol.  Limit alcohol intake to no more than 1 drink per day for nonpregnant women. One drink equals 12 ounces of beer, 5 ounces of wine, or 1 ounces of hard liquor.  Do not use street drugs.  Do not share needles.  Ask your health care provider for help if you need support or information about quitting drugs.  Tell your health care provider if you often feel depressed.  Tell your health care provider if you have ever been abused or do not feel safe at home. Document Released: 09/23/2010  Document Revised: 07/25/2013 Document Reviewed: 02/09/2013 [facility] Patient Information 2015 [place], [place]. This information is not intended to replace advice given to you by your health care provider. Make sure you discuss any questions you have with your health care provider. Lipoma A lipoma is a noncancerous (benign) tumor composed of fat cells. They are usually found under the skin (subcutaneous). A lipoma may occur in any tissue of the body that contains fat. Common areas for lipomas to appear include the back, shoulders, buttocks, and thighs. Lipomas are a very common soft tissue growth. They are soft and grow slowly. Most problems caused by a lipoma depend on where it is growing. DIAGNOSIS  A lipoma can be diagnosed with a physical exam. These tumors rarely become cancerous, but radiographic studies can help determine this for certain. Studies used may include:  Computerized X-ray scans (CT or CAT scan).  Computerized magnetic scans (MRI). TREATMENT  Small lipomas that are not causing problems may be watched. If a lipoma continues to enlarge or causes problems, removal is often the best treatment. Lipomas can also be removed to improve appearance. Surgery is done to remove the fatty cells and the surrounding capsule. Most often, this is done with medicine that numbs the area (local anesthetic). The removed tissue is examined under a microscope to make sure it is not cancerous. Keep all follow-up appointments with your caregiver. SEEK MEDICAL CARE IF:   The lipoma becomes larger or hard.  The lipoma becomes painful, red, or increasingly swollen. These could be signs of infection or a more serious condition. Document Released: 02/28/2002 Document Revised: 06/02/2011 Document Reviewed: 08/10/2009 [facility] Patient Information 2015 [place], [place]. This information is not intended to replace advice given to you by your health care provider. Make sure you discuss any questions you have with your  health care provider.

## 2014-10-10 NOTE — Progress Notes (Signed)
Subjective:    Patient ID: Gina Murray, female    DOB: October 29, 1955, 59 y.o.   MRN: [id number]  Hypertension This is a chronic problem. The current episode started more than 1 year ago. The problem has been resolved since onset. The problem is controlled. Pertinent negatives include no anxiety, headaches, palpitations, peripheral edema or shortness of breath. Risk factors for coronary artery disease include dyslipidemia, post-menopausal state, obesity and family history. Past treatments include beta blockers, ACE inhibitors, calcium channel blockers and diuretics. The current treatment provides mild improvement. There is no history of kidney disease, CAD/MI, CVA, heart failure or a thyroid problem. There is no history of sleep apnea.  Hyperlipidemia This is a chronic problem. The current episode started more than 1 year ago. The problem is controlled. Recent lipid tests were reviewed and are normal. Exacerbating diseases include obesity. She has no history of diabetes or hypothyroidism. Factors aggravating her hyperlipidemia include fatty foods. Pertinent negatives include no focal weakness, leg pain or shortness of breath. Current antihyperlipidemic treatment includes statins. The current treatment provides moderate improvement of lipids. Risk factors for coronary artery disease include dyslipidemia, hypertension, obesity and post-menopausal.  Gastrophageal Reflux She complains of heartburn. She reports no abdominal pain, no belching, no coughing, no nausea or no sore throat. This is a new problem. The current episode started more than 1 month ago (January). The problem occurs frequently. The problem has been waxing and waning. The heartburn duration is several minutes. The heartburn is located in the substernum. The heartburn is of moderate intensity. The heartburn does not wake her from sleep. The symptoms are aggravated by certain foods. Pertinent negatives include no fatigue or muscle weakness. She  has tried an antacid for the symptoms. The treatment provided mild relief.      Review of Systems  Constitutional: Negative.  Negative for fatigue.  HENT: Negative.  Negative for sore throat.   Eyes: Negative.   Respiratory: Negative.  Negative for cough and shortness of breath.   Cardiovascular: Negative.  Negative for palpitations.  Gastrointestinal: Positive for heartburn. Negative for nausea and abdominal pain.  Endocrine: Negative.   Genitourinary: Negative.   Musculoskeletal: Negative.  Negative for muscle weakness.  Neurological: Negative.  Negative for focal weakness and headaches.  Hematological: Negative.   Psychiatric/Behavioral: Negative.   All other systems reviewed and are negative.      Objective:   Physical Exam  Constitutional: She is oriented to person, place, and time. She appears well-developed and well-nourished. No distress.  HENT:  Head: Normocephalic and atraumatic.  Right Ear: External ear normal.  Left Ear: External ear normal.  Nose: Nose normal.  Mouth/Throat: Oropharynx is clear and moist.  Eyes: Pupils are equal, round, and reactive to light.  Neck: Normal range of motion. Neck supple. No thyromegaly present.  Cardiovascular: Normal rate, regular rhythm, normal heart sounds and intact distal pulses.   No murmur heard. Pulmonary/Chest: Effort normal and breath sounds normal. No respiratory distress. She has no wheezes.  Abdominal: Soft. Bowel sounds are normal. She exhibits no distension. There is no tenderness.  Musculoskeletal: Normal range of motion. She exhibits no edema or tenderness.  Neurological: She is alert and oriented to person, place, and time. She has normal reflexes. No cranial nerve deficit.  Skin: Skin is warm and dry.  Psychiatric: She has a normal mood and affect. Her behavior is normal. Judgment and thought content normal.  Vitals reviewed.     BP 128/76 mmHg  Pulse 60  Temp(Src) 97.2 F (36.2 C) (Oral)  Ht 5' 3" (1.6  m)  Wt 176 lb 3.2 oz (79.924 kg)  BMI 31.22 kg/m2  LMP 08/13/2001     Assessment & Plan:  1. Essential hypertension - amLODipine (NORVASC) 10 MG tablet; TAKE ONE TABLET BY MOUTH ONE  TIME DAILY  Dispense: 90 tablet; Refill: 3 - metoprolol succinate (TOPROL-XL) 100 MG 24 hr tablet; Take 1 tablet (100 mg total) by mouth daily. Take with or immediately following a meal.  Dispense: 90 tablet; Refill: 3 - CMP14+EGFR  2. Hyperlipidemia - CMP14+EGFR - Lipid panel  3. Gastroesophageal reflux disease, esophagitis presence not specified - omeprazole (PRILOSEC) 20 MG capsule; TAKE 1 CAPSULE (20 MG TOTAL)  BY MOUTH DAILY.  Dispense: 90 capsule; Refill: 3 - CMP14+EGFR  4. Vitamin D deficiency - CMP14+EGFR - Vit D  25 hydroxy (rtn osteoporosis monitoring)   Continue all meds Labs pending Health Maintenance reviewed Diet and exercise encouraged RTO 6 months   Christy Hawks, FNP   

## 2014-10-11 LAB — CMP14+EGFR
ALT: 30 IU/L (ref 0–32)
AST: 28 IU/L (ref 0–40)
Albumin/Globulin Ratio: 1.7 (ref 1.1–2.5)
Albumin: 4.5 g/dL (ref 3.5–5.5)
Alkaline Phosphatase: 114 IU/L (ref 39–117)
BUN/Creatinine Ratio: 17 (ref 9–23)
BUN: 17 mg/dL (ref 6–24)
Bilirubin Total: 0.4 mg/dL (ref 0.0–1.2)
CO2: 27 mmol/L (ref 18–29)
Calcium: 9.9 mg/dL (ref 8.7–10.2)
Chloride: 98 mmol/L (ref 97–108)
Creatinine, Ser: 1.01 mg/dL — ABNORMAL HIGH (ref 0.57–1.00)
GFR calc Af Amer: 70 mL/min/{1.73_m2} (ref >59)
GFR calc non Af Amer: 61 mL/min/{1.73_m2} (ref >59)
Globulin, Total: 2.6 g/dL (ref 1.5–4.5)
Glucose: 100 mg/dL — ABNORMAL HIGH (ref 65–99)
Potassium: 4.2 mmol/L (ref 3.5–5.2)
Sodium: 140 mmol/L (ref 134–144)
Total Protein: 7.1 g/dL (ref 6.0–8.5)

## 2014-10-11 LAB — LIPID PANEL
Chol/HDL Ratio: 2.4 ratio units (ref 0.0–4.4)
Cholesterol, Total: 193 mg/dL (ref 100–199)
HDL: 82 mg/dL (ref >39)
LDL Calculated: 98 mg/dL (ref 0–99)
Triglycerides: 66 mg/dL (ref 0–149)
VLDL Cholesterol Cal: 13 mg/dL (ref 5–40)

## 2014-10-11 LAB — VITAMIN D 25 HYDROXY (VIT D DEFICIENCY, FRACTURES): Vit D, 25-Hydroxy: 46.7 ng/mL (ref 30.0–100.0)

## 2015-01-01 LAB — HM MAMMOGRAPHY: HM Mammogram: NEGATIVE

## 2015-01-04 ENCOUNTER — Ambulatory Visit: Payer: BLUE CROSS/BLUE SHIELD

## 2015-01-04 DIAGNOSIS — Z23 Encounter for immunization: Secondary | ICD-10-CM

## 2015-03-23 ENCOUNTER — Encounter: Payer: Self-pay | Admitting: *Deleted

## 2015-03-27 ENCOUNTER — Encounter: Payer: Self-pay | Admitting: Nurse Practitioner

## 2015-03-27 ENCOUNTER — Ambulatory Visit (INDEPENDENT_AMBULATORY_CARE_PROVIDER_SITE_OTHER): Payer: BLUE CROSS/BLUE SHIELD | Admitting: Nurse Practitioner

## 2015-03-27 VITALS — BP 125/76 | HR 94 | Temp 98.4°F | Ht 63.0 in | Wt 176.0 lb

## 2015-03-27 DIAGNOSIS — R079 Chest pain, unspecified: Secondary | ICD-10-CM | POA: Diagnosis not present

## 2015-03-27 NOTE — Patient Instructions (Signed)

## 2015-03-27 NOTE — Progress Notes (Signed)
   Subjective:    Patient ID: Gina Murray, female    DOB: 09-13-1955, 60 y.o.   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 Started about 3 weeks ago- occurs daily intermittent- last up to and hour. If she does not think about it then she says it goes away quickly. Denies SOB, no neck or left arm pain. No personal history of heart problems.   She had a stress test several years ago. Normal according to patient.  Review of Systems  Constitutional: Negative.   HENT: Negative.   Respiratory: Negative.   Cardiovascular: Negative.   Genitourinary: Negative.   Neurological: Negative.   Psychiatric/Behavioral: Negative.   All other systems reviewed and are negative.      Objective:   Physical Exam  Constitutional: She is oriented to person, place, and time. She appears well-developed and well-nourished.  Cardiovascular: Normal rate, regular rhythm and normal heart sounds.   Pulmonary/Chest: Effort normal and breath sounds normal. No respiratory distress. She has no wheezes. She has no rales. She exhibits no tenderness.  Neurological: She is alert and oriented to person, place, and time.  Skin: Skin is warm and dry.  Psychiatric: She has a normal mood and affect. Her behavior is normal. Judgment and thought content normal.   BP 125/76 mmHg  Pulse 94  Temp(Src) 98.4 F (36.9 C) (Oral)  Ht 5\' 3"  (1.6 m)  Wt 176 lb (79.833 kg)  BMI 31.18 kg/m2  LMP 08/13/2001       Assessment & Plan:  1. Chest pain, unspecified chest pain type Motrin OTC q6 hours Avoid strenous activity - EKG 12-Lead - Ambulatory referral to Cardiology   [place], FNP

## 2015-04-10 ENCOUNTER — Other Ambulatory Visit: Payer: Self-pay

## 2015-04-10 DIAGNOSIS — I10 Essential (primary) hypertension: Secondary | ICD-10-CM

## 2015-04-10 MED ORDER — LISINOPRIL-HYDROCHLOROTHIAZIDE 20-12.5 MG PO TABS
ORAL_TABLET | ORAL | Status: DC
Start: 2015-04-10 — End: 2015-04-19

## 2015-04-11 ENCOUNTER — Other Ambulatory Visit: Payer: Self-pay

## 2015-04-11 MED ORDER — ATORVASTATIN CALCIUM 80 MG PO TABS: 80.0000 mg | ORAL_TABLET | Freq: Every day | ORAL | Status: DC

## 2015-04-12 ENCOUNTER — Ambulatory Visit: Payer: BLUE CROSS/BLUE SHIELD | Admitting: Family

## 2015-04-19 ENCOUNTER — Ambulatory Visit (INDEPENDENT_AMBULATORY_CARE_PROVIDER_SITE_OTHER): Payer: BLUE CROSS/BLUE SHIELD | Admitting: Family

## 2015-04-19 ENCOUNTER — Encounter: Payer: Self-pay | Admitting: Family

## 2015-04-19 VITALS — BP 126/77 | HR 71 | Temp 97.7°F | Ht 63.0 in | Wt 170.8 lb

## 2015-04-19 DIAGNOSIS — E559 Vitamin D deficiency, unspecified: Secondary | ICD-10-CM | POA: Diagnosis not present

## 2015-04-19 DIAGNOSIS — K219 Gastro-esophageal reflux disease without esophagitis: Secondary | ICD-10-CM

## 2015-04-19 DIAGNOSIS — E785 Hyperlipidemia, unspecified: Secondary | ICD-10-CM

## 2015-04-19 DIAGNOSIS — I10 Essential (primary) hypertension: Secondary | ICD-10-CM

## 2015-04-19 DIAGNOSIS — Z1159 Encounter for screening for other viral diseases: Secondary | ICD-10-CM | POA: Diagnosis not present

## 2015-04-19 MED ORDER — LISINOPRIL-HYDROCHLOROTHIAZIDE 20-12.5 MG PO TABS: ORAL_TABLET | ORAL | Status: DC

## 2015-04-19 MED ORDER — ATORVASTATIN CALCIUM 80 MG PO TABS: 80.0000 mg | ORAL_TABLET | Freq: Every day | ORAL | Status: DC

## 2015-04-19 MED ORDER — AMLODIPINE BESYLATE 10 MG PO TABS: ORAL_TABLET | ORAL | Status: DC

## 2015-04-19 MED ORDER — METOPROLOL SUCCINATE ER 100 MG PO TB24: 100.0000 mg | ORAL_TABLET | Freq: Every day | ORAL | Status: DC

## 2015-04-19 MED ORDER — OMEPRAZOLE 20 MG PO CPDR: DELAYED_RELEASE_CAPSULE | ORAL | Status: DC

## 2015-04-19 NOTE — Progress Notes (Signed)
Subjective:    Patient ID: Gina Murray, female    DOB: 1955-06-05, 60 y.o.   MRN: [id number]  Pt presents to the office today for chronic follow up.  Hypertension This is a chronic problem. The current episode started more than 1 year ago. The problem has been resolved since onset. The problem is controlled. Pertinent negatives include no anxiety, headaches, palpitations, peripheral edema or shortness of breath. Risk factors for coronary artery disease include dyslipidemia, post-menopausal state, obesity and family history. Past treatments include beta blockers, ACE inhibitors, calcium channel blockers and diuretics. The current treatment provides mild improvement. There is no history of kidney disease, CAD/MI, CVA, heart failure or a thyroid problem. There is no history of sleep apnea.  Hyperlipidemia This is a chronic problem. The current episode started more than 1 year ago. The problem is controlled. Recent lipid tests were reviewed and are normal. Exacerbating diseases include obesity. She has no history of diabetes or hypothyroidism. Factors aggravating her hyperlipidemia include fatty foods. Pertinent negatives include no focal weakness, leg pain or shortness of breath. Current antihyperlipidemic treatment includes statins. The current treatment provides moderate improvement of lipids. Risk factors for coronary artery disease include dyslipidemia, hypertension, obesity and post-menopausal.  Gastroesophageal Reflux She complains of heartburn. She reports no abdominal pain, no belching, no coughing, no nausea or no sore throat. This is a new problem. The current episode started more than 1 month ago. The problem occurs rarely. The problem has been resolved. The heartburn duration is several minutes. The heartburn is located in the substernum. The heartburn is of moderate intensity. The heartburn does not wake her from sleep. The symptoms are aggravated by certain foods. Pertinent negatives  include no fatigue or muscle weakness. She has tried an antacid and a PPI for the symptoms. The treatment provided significant relief.      Review of Systems  Constitutional: Negative.  Negative for fatigue.  HENT: Negative.  Negative for sore throat.   Eyes: Negative.   Respiratory: Negative.  Negative for cough and shortness of breath.   Cardiovascular: Negative.  Negative for palpitations.  Gastrointestinal: Positive for heartburn. Negative for nausea and abdominal pain.  Endocrine: Negative.   Genitourinary: Negative.   Musculoskeletal: Negative.  Negative for muscle weakness.  Neurological: Negative.  Negative for focal weakness and headaches.  Hematological: Negative.   Psychiatric/Behavioral: Negative.   All other systems reviewed and are negative.      Objective:   Physical Exam  Constitutional: She is oriented to person, place, and time. She appears well-developed and well-nourished. No distress.  HENT:  Head: Normocephalic and atraumatic.  Right Ear: External ear normal.  Left Ear: External ear normal.  Nose: Nose normal.  Mouth/Throat: Oropharynx is clear and moist.  Eyes: Pupils are equal, round, and reactive to light.  Neck: Normal range of motion. Neck supple. No thyromegaly present.  Cardiovascular: Normal rate, regular rhythm, normal heart sounds and intact distal pulses.   No murmur heard. Pulmonary/Chest: Effort normal and breath sounds normal. No respiratory distress. She has no wheezes.  Abdominal: Soft. Bowel sounds are normal. She exhibits no distension. There is no tenderness.  Musculoskeletal: Normal range of motion. She exhibits no edema or tenderness.  Neurological: She is alert and oriented to person, place, and time. She has normal reflexes. No cranial nerve deficit.  Skin: Skin is warm and dry.  Psychiatric: She has a normal mood and affect. Her behavior is normal. Judgment and thought content normal.  Vitals reviewed.  BP 126/77 mmHg   Pulse 71  Temp(Src) 97.7 F (36.5 C) (Oral)  Ht _0  (1.6 m)  Wt 170 lb 12.8 oz (77.474 kg)  BMI 30.26 kg/m2  LMP 08/13/2001     Assessment & Plan:  1. Essential hypertension - lisinopril-hydrochlorothiazide (PRINZIDE,ZESTORETIC) 20-12.5 MG tablet; TAKE 1 TABLET BY MOUTH 2 (TWO) TIMES DAILY.  Dispense: 180 tablet; Refill: 2 - amLODipine (NORVASC) 10 MG tablet; TAKE ONE TABLET BY MOUTH ONE  TIME DAILY  Dispense: 90 tablet; Refill: 3 - metoprolol succinate (TOPROL-XL) 100 MG 24 hr tablet; Take 1 tablet (100 mg total) by mouth daily. Take with or immediately following a meal.  Dispense: 90 tablet; Refill: 3 - CMP14+EGFR  2. Gastroesophageal reflux disease, esophagitis presence not specified - omeprazole (PRILOSEC) 20 MG capsule; TAKE 1 CAPSULE (20 MG TOTAL)  BY MOUTH DAILY.  Dispense: 90 capsule; Refill: 3 - CMP14+EGFR  3. Hyperlipidemia - atorvastatin (LIPITOR) 80 MG tablet; Take 1 tablet (80 mg total) by mouth daily.  Dispense: 90 tablet; Refill: 3 - CMP14+EGFR - Lipid panel  4. Vitamin D deficiency - CMP14+EGFR - VITAMIN D 25 Hydroxy (Vit-D Deficiency, Fractures)  5. Need for hepatitis C screening test - CMP14+EGFR - Hepatitis C antibody   Continue all meds Labs pending Health Maintenance reviewed- Pt to call insurance about cologuard Diet and exercise encouraged RTO 6 months  Evelina Dun, FNP

## 2015-04-19 NOTE — Patient Instructions (Addendum)
Health Maintenance, Female Adopting a healthy lifestyle and getting preventive care can go a long way to promote health and wellness. Talk with your health care provider about what schedule of regular examinations is right for you. This is a good chance for you to check in with your provider about disease prevention and staying healthy. In between checkups, there are plenty of things you can do on your own. Experts have done a lot of research about which lifestyle changes and preventive measures are most likely to keep you healthy. Ask your health care provider for more information. WEIGHT AND DIET  Eat a healthy diet  Be sure to include plenty of vegetables, fruits, low-fat dairy products, and lean protein.  Do not eat a lot of foods high in solid fats, added sugars, or salt.  Get regular exercise. This is one of the most important things you can do for your health.  Most adults should exercise for at least 150 minutes each week. The exercise should increase your heart rate and make you sweat (moderate-intensity exercise).  Most adults should also do strengthening exercises at least twice a week. This is in addition to the moderate-intensity exercise.  Maintain a healthy weight  Body mass index (BMI) is a measurement that can be used to identify possible weight problems. It estimates body fat based on height and weight. Your health care provider can help determine your BMI and help you achieve or maintain a healthy weight.  For females 20 years of age and older:   A BMI below 18.5 is considered underweight.  A BMI of 18.5 to 24.9 is normal.  A BMI of 25 to 29.9 is considered overweight.  A BMI of 30 and above is considered obese.  Watch levels of cholesterol and blood lipids  You should start having your blood tested for lipids and cholesterol at 60 years of age, then have this test every 5 years.  You may need to have your cholesterol levels checked more often if:  Your lipid  or cholesterol levels are high.  You are older than 60 years of age.  You are at high risk for heart disease.  CANCER SCREENING   Lung Cancer  Lung cancer screening is recommended for adults 55-80 years old who are at high risk for lung cancer because of a history of smoking.  A yearly low-dose CT scan of the lungs is recommended for people who:  Currently smoke.  Have quit within the past 15 years.  Have at least a 30-pack-year history of smoking. A pack year is smoking an average of one pack of cigarettes a day for 1 year.  Yearly screening should continue until it has been 15 years since you quit.  Yearly screening should stop if you develop a health problem that would prevent you from having lung cancer treatment.  Breast Cancer  Practice breast self-awareness. This means understanding how your breasts normally appear and feel.  It also means doing regular breast self-exams. Let your health care provider know about any changes, no matter how small.  If you are in your 20s or 30s, you should have a clinical breast exam (CBE) by a health care provider every 1-3 years as part of a regular health exam.  If you are 40 or older, have a CBE every year. Also consider having a breast X-ray (mammogram) every year.  If you have a family history of breast cancer, talk to your health care provider about genetic screening.  If you   are at high risk for breast cancer, talk to your health care provider about having an MRI and a mammogram every year.  Breast cancer gene (BRCA) assessment is recommended for women who have family members with BRCA-related cancers. BRCA-related cancers include:  Breast.  Ovarian.  Tubal.  Peritoneal cancers.  Results of the assessment will determine the need for genetic counseling and BRCA1 and BRCA2 testing. Cervical Cancer Your health care provider may recommend that you be screened regularly for cancer of the pelvic organs (ovaries, uterus, and  vagina). This screening involves a pelvic examination, including checking for microscopic changes to the surface of your cervix (Pap test). You may be encouraged to have this screening done every 3 years, beginning at age 21.  For women ages 30-65, health care providers may recommend pelvic exams and Pap testing every 3 years, or they may recommend the Pap and pelvic exam, combined with testing for human papilloma virus (HPV), every 5 years. Some types of HPV increase your risk of cervical cancer. Testing for HPV may also be done on women of any age with unclear Pap test results.  Other health care providers may not recommend any screening for nonpregnant women who are considered low risk for pelvic cancer and who do not have symptoms. Ask your health care provider if a screening pelvic exam is right for you.  If you have had past treatment for cervical cancer or a condition that could lead to cancer, you need Pap tests and screening for cancer for at least 20 years after your treatment. If Pap tests have been discontinued, your risk factors (such as having a new sexual partner) need to be reassessed to determine if screening should resume. Some women have medical problems that increase the chance of getting cervical cancer. In these cases, your health care provider may recommend more frequent screening and Pap tests. Colorectal Cancer  This type of cancer can be detected and often prevented.  Routine colorectal cancer screening usually begins at 60 years of age and continues through 60 years of age.  Your health care provider may recommend screening at an earlier age if you have risk factors for colon cancer.  Your health care provider may also recommend using home test kits to check for hidden blood in the stool.  A small camera at the end of a tube can be used to examine your colon directly (sigmoidoscopy or colonoscopy). This is done to check for the earliest forms of colorectal  cancer.  Routine screening usually begins at age 50.  Direct examination of the colon should be repeated every 5-10 years through 60 years of age. However, you may need to be screened more often if early forms of precancerous polyps or small growths are found. Skin Cancer  Check your skin from head to toe regularly.  Tell your health care provider about any new moles or changes in moles, especially if there is a change in a mole's shape or color.  Also tell your health care provider if you have a mole that is larger than the size of a pencil eraser.  Always use sunscreen. Apply sunscreen liberally and repeatedly throughout the day.  Protect yourself by wearing long sleeves, pants, a wide-brimmed hat, and sunglasses whenever you are outside. HEART DISEASE, DIABETES, AND HIGH BLOOD PRESSURE   High blood pressure causes heart disease and increases the risk of stroke. High blood pressure is more likely to develop in:  People who have blood pressure in the high end   of the normal range (130-139/85-89 mm Hg).  People who are overweight or obese.  People who are African American.  If you are 38-23 years of age, have your blood pressure checked every 3-5 years. If you are 61 years of age or older, have your blood pressure checked every year. You should have your blood pressure measured twice--once when you are at a hospital or clinic, and once when you are not at a hospital or clinic. Record the average of the two measurements. To check your blood pressure when you are not at a hospital or clinic, you can use:  An automated blood pressure machine at a pharmacy.  A home blood pressure monitor.  If you are between 45 years and 39 years old, ask your health care provider if you should take aspirin to prevent strokes.  Have regular diabetes screenings. This involves taking a blood sample to check your fasting blood sugar level.  If you are at a normal weight and have a low risk for diabetes,  have this test once every three years after 60 years of age.  If you are overweight and have a high risk for diabetes, consider being tested at a younger age or more often. PREVENTING INFECTION  Hepatitis B  If you have a higher risk for hepatitis B, you should be screened for this virus. You are considered at high risk for hepatitis B if:  You were born in a country where hepatitis B is common. Ask your health care provider which countries are considered high risk.  Your parents were born in a high-risk country, and you have not been immunized against hepatitis B (hepatitis B vaccine).  You have HIV or AIDS.  You use needles to inject street drugs.  You live with someone who has hepatitis B.  You have had sex with someone who has hepatitis B.  You get hemodialysis treatment.  You take certain medicines for conditions, including cancer, organ transplantation, and autoimmune conditions. Hepatitis C  Blood testing is recommended for:  Everyone born from [birth year] through 1965.  Anyone with known risk factors for hepatitis C. Sexually transmitted infections (STIs)  You should be screened for sexually transmitted infections (STIs) including gonorrhea and chlamydia if:  You are sexually active and are younger than 60 years of age.  You are older than 60 years of age and your health care provider tells you that you are at risk for this type of infection.  Your sexual activity has changed since you were last screened and you are at an increased risk for chlamydia or gonorrhea. Ask your health care provider if you are at risk.  If you do not have HIV, but are at risk, it may be recommended that you take a prescription medicine daily to prevent HIV infection. This is called pre-exposure prophylaxis (PrEP). You are considered at risk if:  You are sexually active and do not regularly use condoms or know the HIV status of your partner(s).  You take drugs by injection.  You are sexually  active with a partner who has HIV. Talk with your health care provider about whether you are at high risk of being infected with HIV. If you choose to begin PrEP, you should first be tested for HIV. You should then be tested every 3 months for as long as you are taking PrEP.  PREGNANCY   If you are premenopausal and you may become pregnant, ask your health care provider about preconception counseling.  If you may  become pregnant, take 400 to 800 micrograms (mcg) of folic acid every day.  If you want to prevent pregnancy, talk to your health care provider about birth control (contraception). OSTEOPOROSIS AND MENOPAUSE   Osteoporosis is a disease in which the bones lose minerals and strength with aging. This can result in serious bone fractures. Your risk for osteoporosis can be identified using a bone density scan.  If you are 26 years of age or older, or if you are at risk for osteoporosis and fractures, ask your health care provider if you should be screened.  Ask your health care provider whether you should take a calcium or vitamin D supplement to lower your risk for osteoporosis.  Menopause may have certain physical symptoms and risks.  Hormone replacement therapy may reduce some of these symptoms and risks. Talk to your health care provider about whether hormone replacement therapy is right for you.  HOME CARE INSTRUCTIONS   Schedule regular health, dental, and eye exams.  Stay current with your immunizations.   Do not use any tobacco products including cigarettes, chewing tobacco, or electronic cigarettes.  If you are pregnant, do not drink alcohol.  If you are breastfeeding, limit how much and how often you drink alcohol.  Limit alcohol intake to no more than 1 drink per day for nonpregnant women. One drink equals 12 ounces of beer, 5 ounces of wine, or 1 ounces of hard liquor.  Do not use street drugs.  Do not share needles.  Ask your health care provider for help if  you need support or information about quitting drugs.  Tell your health care provider if you often feel depressed.  Tell your health care provider if you have ever been abused or do not feel safe at home.   This information is not intended to replace advice given to you by your health care provider. Make sure you discuss any questions you have with your health care provider.   Document Released: 09/23/2010 Document Revised: 03/31/2014 Document Reviewed: 02/09/2013 Elsevier Interactive Patient Education 2016 [employer] Lipoma A lipoma is a noncancerous (benign) tumor that is made up of fat cells. This is a very common type of soft-tissue growth. Lipomas are usually found under the skin (subcutaneous). They may occur in any tissue of the body that contains fat. Common areas for lipomas to appear include the back, shoulders, buttocks, and thighs. Lipomas grow slowly, and they are usually painless. Most lipomas do not cause problems and do not require treatment. CAUSES The cause of this condition is not known. RISK FACTORS This condition is more likely to develop in:  People who are 17-39 years old.  People who have a family history of lipomas. SYMPTOMS A lipoma usually appears as a small, round bump under the skin. It may feel soft or rubbery, but the firmness can vary. Most lipomas are not painful. However, a lipoma may become painful if it is located in an area where it pushes on nerves. DIAGNOSIS A lipoma can usually be diagnosed with a physical exam. You may also have tests to confirm the diagnosis and to rule out other conditions. Tests may include:  Imaging tests, such as a CT scan or MRI.  Removal of a tissue sample to be looked at under a microscope (biopsy). TREATMENT Treatment is not needed for small lipomas that are not causing problems. If a lipoma continues to get bigger or it causes problems, removal is often the best option. Lipomas can also be removed to  improve  appearance. Removal of a lipoma is usually done with a surgery in which the fatty cells and the surrounding capsule are removed. Most often, a medicine that numbs the area (local anesthetic) is used for this procedure. HOME CARE INSTRUCTIONS  Keep all follow-up visits as directed by your health care provider. This is important. SEEK MEDICAL CARE IF:  Your lipoma becomes larger or hard.  Your lipoma becomes painful, red, or increasingly swollen. These could be signs of infection or a more serious condition.   This information is not intended to replace advice given to you by your health care provider. Make sure you discuss any questions you have with your health care provider.   Document Released: 02/28/2002 Document Revised: 07/25/2014 Document Reviewed: 03/06/2014 Elsevier Interactive Patient Education [employer].

## 2015-04-20 LAB — CMP14+EGFR
ALT: 24 IU/L (ref 0–32)
AST: 24 IU/L (ref 0–40)
Albumin/Globulin Ratio: 1.6 (ref 1.1–2.5)
Albumin: 4.3 g/dL (ref 3.5–5.5)
Alkaline Phosphatase: 97 IU/L (ref 39–117)
BUN/Creatinine Ratio: 18 (ref 9–23)
BUN: 18 mg/dL (ref 6–24)
Bilirubin Total: 0.4 mg/dL (ref 0.0–1.2)
CO2: 26 mmol/L (ref 18–29)
Calcium: 10 mg/dL (ref 8.7–10.2)
Chloride: 98 mmol/L (ref 96–106)
Creatinine, Ser: 1.02 mg/dL — ABNORMAL HIGH (ref 0.57–1.00)
GFR calc Af Amer: 70 mL/min/{1.73_m2} (ref >59)
GFR calc non Af Amer: 60 mL/min/{1.73_m2} (ref >59)
Globulin, Total: 2.7 g/dL (ref 1.5–4.5)
Glucose: 105 mg/dL — ABNORMAL HIGH (ref 65–99)
Potassium: 4.1 mmol/L (ref 3.5–5.2)
Sodium: 139 mmol/L (ref 134–144)
Total Protein: 7 g/dL (ref 6.0–8.5)

## 2015-04-20 LAB — LIPID PANEL
Chol/HDL Ratio: 2.3 ratio units (ref 0.0–4.4)
Cholesterol, Total: 168 mg/dL (ref 100–199)
HDL: 74 mg/dL (ref >39)
LDL Calculated: 82 mg/dL (ref 0–99)
Triglycerides: 62 mg/dL (ref 0–149)
VLDL Cholesterol Cal: 12 mg/dL (ref 5–40)

## 2015-04-20 LAB — HEPATITIS C ANTIBODY: Hep C Virus Ab: <0.1 s/co ratio (ref 0.0–0.9)

## 2015-04-20 LAB — VITAMIN D 25 HYDROXY (VIT D DEFICIENCY, FRACTURES): Vit D, 25-Hydroxy: 49 ng/mL (ref 30.0–100.0)

## 2015-04-24 ENCOUNTER — Ambulatory Visit (INDEPENDENT_AMBULATORY_CARE_PROVIDER_SITE_OTHER): Payer: BLUE CROSS/BLUE SHIELD | Admitting: Cardiology

## 2015-04-24 ENCOUNTER — Encounter: Payer: Self-pay | Admitting: Cardiology

## 2015-04-24 VITALS — BP 140/72 | HR 83 | Ht 63.0 in | Wt 170.0 lb

## 2015-04-24 DIAGNOSIS — R079 Chest pain, unspecified: Secondary | ICD-10-CM

## 2015-04-24 NOTE — Patient Instructions (Signed)
Medication Instructions:  Your physician recommends that you continue on your current medications as directed. Please refer to the Current Medication list given to you today.   Labwork: none  Testing/Procedures: Your physician has requested that you have an exercise tolerance test. For further information please visit [email or web address]. Please also follow instruction sheet, as given.    Follow-Up: Follow up with Dr. Percival Spanish pending results of stress test.  Any Other Special Instructions Will Be Listed Below (If Applicable).     If you need a refill on your cardiac medications before your next appointment, please call your pharmacy.

## 2015-04-24 NOTE — Progress Notes (Signed)
Cardiology Office Note   Date:  04/24/2015   ID:  Gina Murray, DOB 10-Nov-1955, MRN [id number]  PCP:  Gina Dun, FNP  Cardiologist:   Minus Breeding, MD   Chief Complaint  Patient presents with  . Chest Pain      History of Present Illness: Gina Murray is a 60 y.o. female who presents for evaluation of chest discomfort. I saw her 12 years ago initially for this. She does not have a history of coronary disease or other cardiovascular disease. She reports that she was getting some chest discomfort in December. This was in her mid chest. It went on for approximately a month coming and going. There was some slight left arm discomfort. She described it as 4 out of 10 in intensity. There was some tightness. However, it seems to have stopped the last several days. 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 She described it as left sternum. She thinks was similar to previous. There are no associated symptoms. It came and went spontaneously. She denies any new shortness of breath, PND or orthopnea. She's had no palpitations, presyncope or syncope.   Past Medical History  Diagnosis Date  . Hyperlipidemia   . Hypertension   . Vitamin D deficiency   . Multiple skin nodules   . GERD (gastroesophageal reflux disease) 04/22/2013    Past Surgical History  Procedure Laterality Date  . Cesarean section    . Cataract extraction w/phaco Right 01/30/2014    Procedure: CATARACT EXTRACTION PHACO AND INTRAOCULAR LENS PLACEMENT (IOC);  Surgeon: Tonny Branch, MD;  Location: [facility];  Service: Ophthalmology;  Laterality: Right;  CDE 13.53     Current Outpatient Prescriptions  Medication Sig Dispense Refill  . amLODipine (NORVASC) 10 MG tablet TAKE ONE TABLET BY MOUTH ONE  TIME DAILY 90 tablet 3  . atorvastatin (LIPITOR) 80 MG tablet Take 1 tablet (80 mg total) by mouth daily. 90 tablet 3  . calcium-vitamin D (OSCAL WITH D) 500-200 MG-UNIT per  tablet Take 2 tablets by mouth daily with breakfast.    . Cholecalciferol (VITAMIN D) 2000 UNITS tablet Take 2,000 Units by mouth daily.    Marland Kitchen lisinopril-hydrochlorothiazide (PRINZIDE,ZESTORETIC) 20-12.5 MG tablet TAKE 1 TABLET BY MOUTH 2 (TWO) TIMES DAILY. 180 tablet 2  . metoprolol succinate (TOPROL-XL) 100 MG 24 hr tablet Take 1 tablet (100 mg total) by mouth daily. Take with or immediately following a meal. 90 tablet 3  . omeprazole (PRILOSEC) 20 MG capsule TAKE 1 CAPSULE (20 MG TOTAL)  BY MOUTH DAILY. 90 capsule 3   No current facility-administered medications for this visit.    Allergies:   Latex; Penicillins; and Sulfa antibiotics    Social History:  The patient  reports that she has never smoked. She does not have any smokeless tobacco history on file. She reports that she does not drink alcohol or use illicit drugs.   Family History:  The patient's family history includes Heart disease (age of onset: 45) in her mother; Hypertension in her mother; Kidney disease in her father.    ROS:  Please see the history of present illness.   Otherwise, review of systems are positive for none.   All other systems are reviewed and negative.    PHYSICAL EXAM: VS:  BP 140/72 mmHg  Pulse 83  Ht 5\' 3"  (1.6 m)  Wt 170 lb (77.111 kg)  BMI 30.12 kg/m2  LMP  08/13/2001 , BMI Body mass index is 30.12 kg/(m^2). GENERAL:  Well appearing HEENT:  Pupils equal round and reactive, fundi not visualized, oral mucosa unremarkable NECK:  No jugular venous distention, waveform within normal limits, carotid upstroke brisk and symmetric, no bruits, no thyromegaly LYMPHATICS:  No cervical, inguinal adenopathy LUNGS:  Clear to auscultation bilaterally BACK:  No CVA tenderness CHEST:  Unremarkable HEART:  PMI not displaced or sustained,S1 and S2 within normal limits, no S3, no S4, no clicks, no rubs, no murmurs ABD:  Flat, positive bowel sounds normal in frequency in pitch, no bruits, no rebound, no guarding, no  midline pulsatile mass, no hepatomegaly, no splenomegaly EXT:  2 plus pulses throughout, no edema, no cyanosis no clubbing SKIN:  No rashes no nodules NEURO:  Cranial nerves II through XII grossly intact, motor grossly intact throughout PSYCH:  Cognitively intact, oriented to person place and time    EKG:  EKG is ordered today. The ekg ordered today demonstrates sinus rhythm, rate 83, axis within normal limits, intervals within normal limits, no acute ST-T wave changes.   Recent Labs: 04/19/2015: ALT 24; BUN 18; Creatinine, Ser 1.02*; Potassium 4.1; Sodium 139    Lipid Panel    Component Value Date/Time   CHOL 168 04/19/2015 0854   CHOL 172 04/22/2013 0908   CHOL 182 08/13/2012 0931   TRIG 62 04/19/2015 0854   TRIG 47 04/22/2013 0908   TRIG 65 08/13/2012 0931   HDL 74 04/19/2015 0854   HDL 88 04/22/2013 0908   HDL 77 08/13/2012 0931   CHOLHDL 2.3 04/19/2015 0854   LDLCALC 82 04/19/2015 0854   LDLCALC 75 04/22/2013 0908   LDLCALC 92 08/13/2012 0931      Wt Readings from Last 3 Encounters:  04/24/15 170 lb (77.111 kg)  04/19/15 170 lb 12.8 oz (77.474 kg)  03/27/15 176 lb (79.833 kg)      Other studies Reviewed: Additional studies/ records that were reviewed today include: Office records. Review of the above records demonstrates:  Please see elsewhere in the note.     ASSESSMENT AND PLAN:  CHEST PAIN:  This is atypical. However, she does have a family history of early coronary disease. She has dyslipidemia and hypertension.  I will bring the patient back for a POET (Plain Old Exercise Test). This will allow me to screen for obstructive coronary disease, risk stratify and very importantly provide a prescription for exercise.   HTN:  Her blood pressure is controlled although upper limits of normal. She will keep an eye on this.  DYSLIPIDEMIA:  Lipid profile is excellent and she will remain on the meds as listed    Current medicines are reviewed at length with the  patient today.  The patient does not have concerns regarding medicines.  The following changes have been made:  no change  Labs/ tests ordered today include:   Orders Placed This Encounter  Procedures  . Exercise Tolerance Test  . EKG 12-Lead     Disposition:   FU with me as needed.      Signed, Minus Breeding, MD  04/24/2015 5:53 PM    [place]

## 2015-05-01 ENCOUNTER — Ambulatory Visit: Payer: Self-pay | Admitting: Cardiology

## 2015-05-01 ENCOUNTER — Telehealth: Payer: Self-pay | Admitting: Cardiology

## 2015-05-01 NOTE — Telephone Encounter (Signed)
New message     Patient calling c/o blood pressure after walking is low    Pt c/o BP issue: STAT if pt c/o blurred vision, one-sided weakness or slurred speech  1. What are your last 5 BP readings? Today after walking 106/51.  90/50 /  89/48  Two different days   2. Are you having any other symptoms (ex. Dizziness, headache, blurred vision, passed out)? No   3. What is your BP issue? Wants to discuss with nurse

## 2015-05-01 NOTE — Telephone Encounter (Signed)
OV last week Instructed walk 30 min a day BP pre and post activity  Date   Pre              Post  2/1                       102/61 102 2/2  110/56  69    90/50   106 2/3  123/59 74    106/53  102 2/4  101/53 77    117/54    94 2/5  113/65  06     89/48  105 2/6  135/66 80    110/53  109 2/7  116/56 88     106/51  102  Patient said she is only drinks fluids prior to meal.  Advised patient to drink 16 to 32 oz of fluid 30-60 minutes prior to activity See if this helps Call back with BP HR after a few days of increasing fluid with activity Will route her pre and post activity BP and HR to Dr. Percival Spanish for further instructions

## 2015-05-01 NOTE — Telephone Encounter (Signed)
Agree 

## 2015-05-04 ENCOUNTER — Telehealth: Payer: Self-pay | Admitting: Cardiology

## 2015-05-04 NOTE — Telephone Encounter (Signed)
New Message   Pt calling to speak w/ rN concerning recent BP readings. Please call back and discuss.

## 2015-05-04 NOTE — Telephone Encounter (Signed)
BP reading before and after activity this week Has pre-hydrated with 16 oz of water each walk  2/8    No activity 2/9    AM 15 min walk      108/58  71  pre                                         103/50  85   post         PM 15 min walk       113/56  71  pre                                            97/50 92  post 2/10 AM 30 min walk       113/60  75  pre                                         101/54  95  Post  Encourage to hydrate maybe a little more, maybe 24 oz before each activity

## 2015-05-06 NOTE — Telephone Encounter (Signed)
Agree 

## 2015-05-08 ENCOUNTER — Telehealth (HOSPITAL_COMMUNITY): Payer: Self-pay

## 2015-05-08 NOTE — Telephone Encounter (Signed)
Encounter complete. 

## 2015-05-10 ENCOUNTER — Ambulatory Visit (HOSPITAL_COMMUNITY)
Admission: RE | Admit: 2015-05-10 | Discharge: 2015-05-10 | Disposition: A | Discharge status: Home / Self Care | Payer: BLUE CROSS/BLUE SHIELD | Source: Ambulatory Visit | Attending: Cardiology | Admitting: Cardiology

## 2015-05-10 DIAGNOSIS — R079 Chest pain, unspecified: Secondary | ICD-10-CM | POA: Diagnosis not present

## 2015-05-10 LAB — EXERCISE TOLERANCE TEST
Estimated workload: 10.1 METS
Exercise duration (min): 9 min
Exercise duration (sec): 0 s
MPHR: 161 {beats}/min
Peak HR: 164 {beats}/min
Percent HR: 101 %
RPE: 17
Rest HR: 101 {beats}/min

## 2015-05-11 ENCOUNTER — Encounter: Payer: Self-pay | Admitting: *Deleted

## 2015-05-14 ENCOUNTER — Other Ambulatory Visit (INDEPENDENT_AMBULATORY_CARE_PROVIDER_SITE_OTHER): Payer: BLUE CROSS/BLUE SHIELD

## 2015-05-14 ENCOUNTER — Other Ambulatory Visit: Payer: Self-pay | Admitting: Family

## 2015-05-14 DIAGNOSIS — R300 Dysuria: Secondary | ICD-10-CM | POA: Diagnosis not present

## 2015-05-14 LAB — POCT UA - MICROSCOPIC ONLY
Casts, Ur, LPF, POC: NEGATIVE
Crystals, Ur, HPF, POC: NEGATIVE
Mucus, UA: NEGATIVE
Yeast, UA: NEGATIVE

## 2015-05-14 LAB — POCT URINALYSIS DIPSTICK
Bilirubin, UA: NEGATIVE
Glucose, UA: NEGATIVE
Ketones, UA: NEGATIVE
Nitrite, UA: NEGATIVE
Protein, UA: NEGATIVE
Spec Grav, UA: 1.015
Urobilinogen, UA: NEGATIVE
pH, UA: 5

## 2015-05-14 MED ORDER — NITROFURANTOIN MONOHYD MACRO 100 MG PO CAPS: 100.0000 mg | ORAL_CAPSULE | Freq: Two times a day (BID) | ORAL | Status: DC

## 2015-05-16 LAB — URINE CULTURE

## 2015-05-18 ENCOUNTER — Telehealth: Payer: Self-pay

## 2015-05-18 NOTE — Telephone Encounter (Signed)
lmtcb

## 2015-05-21 NOTE — Telephone Encounter (Signed)
Advised patient to take an 81 mg aspirin per Dr Percival Spanish.  Patient is also concerned with her blood pressure after exercise. The lowest reading she has had was 89/48. Patient states she is completely asymptomatic "I don't even know my blood pressure is low." Patient denies any lightheadedness/dizziness or feeling like she'll pass out. Per phone note from 05/04/2015, patient was advised to increase her fluid intake to 24oz prior to exercise. Patient confirmed this and that she has been drinking at least 24oz prior to activity. Patient is concerned with how low her blood pressure is getting. I advised her that it is normal for her blood pressure to get lower after exercise. I recommended that she continue to stay hydrated and to even drink water while exercising. I reassured her that she was completely asymptomatic and that is was normal for her blood pressure to be lower after exercise.   Patient usually exercises in the late afternoon/early evening.   Advised patient that I would send this information to Dr Percival Spanish and would get back to her with any changes. Advised patient to call back if she develops any symptoms.  Patient in on Amlodipine 10 mg, Lisinopril-HCT 20-12.5 mg, and Metoprolol 100 mg.

## 2015-05-22 NOTE — Telephone Encounter (Signed)
Agree.  Since her resting BP fluctuates I would continue the current dose of Norvasc.  She can call us if she has symptoms or things start to run low.

## 2015-05-25 NOTE — Telephone Encounter (Signed)
Spoke with pt and informed her of Dr. Rosezella [place] recommendations. Pt verbalized understanding and was in agreement with this plan.

## 2015-06-19 ENCOUNTER — Telehealth: Payer: Self-pay | Admitting: Cardiology

## 2015-06-19 NOTE — Telephone Encounter (Signed)
Pt keeping track of BP readings. Concerned due to BP being low  She notes it runs typically 95-105/50-60. She states this is before and after activity, and notes her HR does not really go up in response to exercise/activity either. HR in 60s-70s.  She was told to call if BPs continued to run low, to call - could consider cutting BP meds back.   She does "feel funny in the head" but "not dizzy". No other specific symptoms.  Pt aware of discussion last month about possibly scaling back amlodipine. Will route to Dr. Percival Spanish to see if OK to cut amlodipine.

## 2015-06-19 NOTE — Telephone Encounter (Signed)
Left msg for patient to call. 

## 2015-06-19 NOTE — Telephone Encounter (Signed)
Reduce Norvasc to 5 mg po daily.

## 2015-06-19 NOTE — Telephone Encounter (Signed)
New Message:   Please call,concerning her blood pressure readings.

## 2015-06-21 NOTE — Telephone Encounter (Signed)
Left msg for patient to call. 

## 2015-06-21 NOTE — Telephone Encounter (Signed)
Recommendations given to patient. Pt voiced understanding of instructions. She will call back in 1-2 weeks w/ update on BPs after med change.

## 2015-06-21 NOTE — Telephone Encounter (Signed)
F/u  Pt returning RN phone call. Please call back and discuss.   

## 2015-08-27 ENCOUNTER — Encounter: Payer: Self-pay | Admitting: *Deleted

## 2015-08-28 ENCOUNTER — Encounter: Payer: Self-pay | Admitting: *Deleted

## 2015-10-08 DIAGNOSIS — Z961 Presence of intraocular lens: Secondary | ICD-10-CM | POA: Diagnosis not present

## 2015-10-08 DIAGNOSIS — H26491 Other secondary cataract, right eye: Secondary | ICD-10-CM | POA: Diagnosis not present

## 2015-10-08 DIAGNOSIS — H2513 Age-related nuclear cataract, bilateral: Secondary | ICD-10-CM | POA: Diagnosis not present

## 2015-10-10 ENCOUNTER — Telehealth: Payer: Self-pay | Admitting: Family

## 2015-10-10 NOTE — Telephone Encounter (Signed)
Olive Branch already transferred rxs

## 2015-10-17 ENCOUNTER — Encounter: Payer: Self-pay | Admitting: Family

## 2015-10-17 ENCOUNTER — Ambulatory Visit (INDEPENDENT_AMBULATORY_CARE_PROVIDER_SITE_OTHER): Payer: BLUE CROSS/BLUE SHIELD | Admitting: Family

## 2015-10-17 VITALS — BP 117/69 | HR 67 | Temp 97.9°F | Ht 63.0 in | Wt 167.4 lb

## 2015-10-17 DIAGNOSIS — K219 Gastro-esophageal reflux disease without esophagitis: Secondary | ICD-10-CM

## 2015-10-17 DIAGNOSIS — E559 Vitamin D deficiency, unspecified: Secondary | ICD-10-CM | POA: Diagnosis not present

## 2015-10-17 DIAGNOSIS — I10 Essential (primary) hypertension: Secondary | ICD-10-CM

## 2015-10-17 DIAGNOSIS — Z1211 Encounter for screening for malignant neoplasm of colon: Secondary | ICD-10-CM

## 2015-10-17 DIAGNOSIS — Z114 Encounter for screening for human immunodeficiency virus [HIV]: Secondary | ICD-10-CM | POA: Diagnosis not present

## 2015-10-17 DIAGNOSIS — E663 Overweight: Secondary | ICD-10-CM

## 2015-10-17 DIAGNOSIS — E785 Hyperlipidemia, unspecified: Secondary | ICD-10-CM | POA: Diagnosis not present

## 2015-10-17 DIAGNOSIS — E669 Obesity, unspecified: Secondary | ICD-10-CM | POA: Insufficient documentation

## 2015-10-17 MED ORDER — LISINOPRIL-HYDROCHLOROTHIAZIDE 20-12.5 MG PO TABS: ORAL_TABLET | ORAL | 2 refills | Status: DC

## 2015-10-17 NOTE — Patient Instructions (Signed)
Health Maintenance, Female Adopting a healthy lifestyle and getting preventive care can go a long way to promote health and wellness. Talk with your health care provider about what schedule of regular examinations is right for you. This is a good chance for you to check in with your provider about disease prevention and staying healthy. In between checkups, there are plenty of things you can do on your own. Experts have done a lot of research about which lifestyle changes and preventive measures are most likely to keep you healthy. Ask your health care provider for more information. WEIGHT AND DIET  Eat a healthy diet  Be sure to include plenty of vegetables, fruits, low-fat dairy products, and lean protein.  Do not eat a lot of foods high in solid fats, added sugars, or salt.  Get regular exercise. This is one of the most important things you can do for your health.  Most adults should exercise for at least 150 minutes each week. The exercise should increase your heart rate and make you sweat (moderate-intensity exercise).  Most adults should also do strengthening exercises at least twice a week. This is in addition to the moderate-intensity exercise.  Maintain a healthy weight  Body mass index (BMI) is a measurement that can be used to identify possible weight problems. It estimates body fat based on height and weight. Your health care provider can help determine your BMI and help you achieve or maintain a healthy weight.  For females 20 years of age and older:   A BMI below 18.5 is considered underweight.  A BMI of 18.5 to 24.9 is normal.  A BMI of 25 to 29.9 is considered overweight.  A BMI of 30 and above is considered obese.  Watch levels of cholesterol and blood lipids  You should start having your blood tested for lipids and cholesterol at 60 years of age, then have this test every 5 years.  You may need to have your cholesterol levels checked more often if:  Your lipid  or cholesterol levels are high.  You are older than 60 years of age.  You are at high risk for heart disease.  CANCER SCREENING   Lung Cancer  Lung cancer screening is recommended for adults 55-80 years old who are at high risk for lung cancer because of a history of smoking.  A yearly low-dose CT scan of the lungs is recommended for people who:  Currently smoke.  Have quit within the past 15 years.  Have at least a 30-pack-year history of smoking. A pack year is smoking an average of one pack of cigarettes a day for 1 year.  Yearly screening should continue until it has been 15 years since you quit.  Yearly screening should stop if you develop a health problem that would prevent you from having lung cancer treatment.  Breast Cancer  Practice breast self-awareness. This means understanding how your breasts normally appear and feel.  It also means doing regular breast self-exams. Let your health care provider know about any changes, no matter how small.  If you are in your 20s or 30s, you should have a clinical breast exam (CBE) by a health care provider every 1-3 years as part of a regular health exam.  If you are 40 or older, have a CBE every year. Also consider having a breast X-ray (mammogram) every year.  If you have a family history of breast cancer, talk to your health care provider about genetic screening.  If you   are at high risk for breast cancer, talk to your health care provider about having an MRI and a mammogram every year.  Breast cancer gene (BRCA) assessment is recommended for women who have family members with BRCA-related cancers. BRCA-related cancers include:  Breast.  Ovarian.  Tubal.  Peritoneal cancers.  Results of the assessment will determine the need for genetic counseling and BRCA1 and BRCA2 testing. Cervical Cancer Your health care provider may recommend that you be screened regularly for cancer of the pelvic organs (ovaries, uterus, and  vagina). This screening involves a pelvic examination, including checking for microscopic changes to the surface of your cervix (Pap test). You may be encouraged to have this screening done every 3 years, beginning at age 21.  For women ages 30-65, health care providers may recommend pelvic exams and Pap testing every 3 years, or they may recommend the Pap and pelvic exam, combined with testing for human papilloma virus (HPV), every 5 years. Some types of HPV increase your risk of cervical cancer. Testing for HPV may also be done on women of any age with unclear Pap test results.  Other health care providers may not recommend any screening for nonpregnant women who are considered low risk for pelvic cancer and who do not have symptoms. Ask your health care provider if a screening pelvic exam is right for you.  If you have had past treatment for cervical cancer or a condition that could lead to cancer, you need Pap tests and screening for cancer for at least 20 years after your treatment. If Pap tests have been discontinued, your risk factors (such as having a new sexual partner) need to be reassessed to determine if screening should resume. Some women have medical problems that increase the chance of getting cervical cancer. In these cases, your health care provider may recommend more frequent screening and Pap tests. Colorectal Cancer  This type of cancer can be detected and often prevented.  Routine colorectal cancer screening usually begins at 60 years of age and continues through 60 years of age.  Your health care provider may recommend screening at an earlier age if you have risk factors for colon cancer.  Your health care provider may also recommend using home test kits to check for hidden blood in the stool.  A small camera at the end of a tube can be used to examine your colon directly (sigmoidoscopy or colonoscopy). This is done to check for the earliest forms of colorectal  cancer.  Routine screening usually begins at age 50.  Direct examination of the colon should be repeated every 5-10 years through 60 years of age. However, you may need to be screened more often if early forms of precancerous polyps or small growths are found. Skin Cancer  Check your skin from head to toe regularly.  Tell your health care provider about any new moles or changes in moles, especially if there is a change in a mole's shape or color.  Also tell your health care provider if you have a mole that is larger than the size of a pencil eraser.  Always use sunscreen. Apply sunscreen liberally and repeatedly throughout the day.  Protect yourself by wearing long sleeves, pants, a wide-brimmed hat, and sunglasses whenever you are outside. HEART DISEASE, DIABETES, AND HIGH BLOOD PRESSURE   High blood pressure causes heart disease and increases the risk of stroke. High blood pressure is more likely to develop in:  People who have blood pressure in the high end   of the normal range (130-139/85-89 mm Hg).  People who are overweight or obese.  People who are African American.  If you are 38-23 years of age, have your blood pressure checked every 3-5 years. If you are 61 years of age or older, have your blood pressure checked every year. You should have your blood pressure measured twice--once when you are at a hospital or clinic, and once when you are not at a hospital or clinic. Record the average of the two measurements. To check your blood pressure when you are not at a hospital or clinic, you can use:  An automated blood pressure machine at a pharmacy.  A home blood pressure monitor.  If you are between 45 years and 39 years old, ask your health care provider if you should take aspirin to prevent strokes.  Have regular diabetes screenings. This involves taking a blood sample to check your fasting blood sugar level.  If you are at a normal weight and have a low risk for diabetes,  have this test once every three years after 60 years of age.  If you are overweight and have a high risk for diabetes, consider being tested at a younger age or more often. PREVENTING INFECTION  Hepatitis B  If you have a higher risk for hepatitis B, you should be screened for this virus. You are considered at high risk for hepatitis B if:  You were born in a country where hepatitis B is common. Ask your health care provider which countries are considered high risk.  Your parents were born in a high-risk country, and you have not been immunized against hepatitis B (hepatitis B vaccine).  You have HIV or AIDS.  You use needles to inject street drugs.  You live with someone who has hepatitis B.  You have had sex with someone who has hepatitis B.  You get hemodialysis treatment.  You take certain medicines for conditions, including cancer, organ transplantation, and autoimmune conditions. Hepatitis C  Blood testing is recommended for:  Everyone born from [birth year] through 1965.  Anyone with known risk factors for hepatitis C. Sexually transmitted infections (STIs)  You should be screened for sexually transmitted infections (STIs) including gonorrhea and chlamydia if:  You are sexually active and are younger than 60 years of age.  You are older than 60 years of age and your health care provider tells you that you are at risk for this type of infection.  Your sexual activity has changed since you were last screened and you are at an increased risk for chlamydia or gonorrhea. Ask your health care provider if you are at risk.  If you do not have HIV, but are at risk, it may be recommended that you take a prescription medicine daily to prevent HIV infection. This is called pre-exposure prophylaxis (PrEP). You are considered at risk if:  You are sexually active and do not regularly use condoms or know the HIV status of your partner(s).  You take drugs by injection.  You are sexually  active with a partner who has HIV. Talk with your health care provider about whether you are at high risk of being infected with HIV. If you choose to begin PrEP, you should first be tested for HIV. You should then be tested every 3 months for as long as you are taking PrEP.  PREGNANCY   If you are premenopausal and you may become pregnant, ask your health care provider about preconception counseling.  If you may  become pregnant, take 400 to 800 micrograms (mcg) of folic acid every day.  If you want to prevent pregnancy, talk to your health care provider about birth control (contraception). OSTEOPOROSIS AND MENOPAUSE   Osteoporosis is a disease in which the bones lose minerals and strength with aging. This can result in serious bone fractures. Your risk for osteoporosis can be identified using a bone density scan.  If you are 61 years of age or older, or if you are at risk for osteoporosis and fractures, ask your health care provider if you should be screened.  Ask your health care provider whether you should take a calcium or vitamin D supplement to lower your risk for osteoporosis.  Menopause may have certain physical symptoms and risks.  Hormone replacement therapy may reduce some of these symptoms and risks. Talk to your health care provider about whether hormone replacement therapy is right for you.  HOME CARE INSTRUCTIONS   Schedule regular health, dental, and eye exams.  Stay current with your immunizations.   Do not use any tobacco products including cigarettes, chewing tobacco, or electronic cigarettes.  If you are pregnant, do not drink alcohol.  If you are breastfeeding, limit how much and how often you drink alcohol.  Limit alcohol intake to no more than 1 drink per day for nonpregnant women. One drink equals 12 ounces of beer, 5 ounces of wine, or 1 ounces of hard liquor.  Do not use street drugs.  Do not share needles.  Ask your health care provider for help if  you need support or information about quitting drugs.  Tell your health care provider if you often feel depressed.  Tell your health care provider if you have ever been abused or do not feel safe at home.   This information is not intended to replace advice given to you by your health care provider. Make sure you discuss any questions you have with your health care provider.   Document Released: 09/23/2010 Document Revised: 03/31/2014 Document Reviewed: 02/09/2013 Elsevier Interactive Patient Education [employer].

## 2015-10-17 NOTE — Progress Notes (Signed)
Subjective:    Patient ID: Gina Murray, female    DOB: [birth year], 60 y.o.   MRN: [id number]  Pt presents to the office today for chronic follow up.  Gastroesophageal Reflux  She reports no abdominal pain, no belching, no coughing, no heartburn, no nausea or no sore throat. This is a new problem. The current episode started more than 1 month ago. The problem occurs rarely. The problem has been resolved. The heartburn does not wake her from sleep. The symptoms are aggravated by certain foods. Pertinent negatives include no fatigue or muscle weakness. She has tried an antacid and a PPI for the symptoms. The treatment provided significant relief.  Hypertension  This is a chronic problem. The current episode started more than 1 year ago. The problem has been resolved since onset. The problem is controlled. Pertinent negatives include no anxiety, headaches, palpitations, peripheral edema or shortness of breath. Risk factors for coronary artery disease include dyslipidemia, post-menopausal state, obesity and family history. Past treatments include beta blockers, ACE inhibitors, calcium channel blockers and diuretics. The current treatment provides significant improvement. There is no history of kidney disease, CAD/MI, CVA, heart failure or a thyroid problem. There is no history of sleep apnea.  Hyperlipidemia  This is a chronic problem. The current episode started more than 1 year ago. The problem is controlled. Recent lipid tests were reviewed and are normal. Exacerbating diseases include obesity. She has no history of diabetes or hypothyroidism. Factors aggravating her hyperlipidemia include fatty foods. Pertinent negatives include no focal weakness, leg pain or shortness of breath. Current antihyperlipidemic treatment includes statins. The current treatment provides moderate improvement of lipids. Risk factors for coronary artery disease include dyslipidemia, hypertension, obesity and post-menopausal.       Review of Systems  Constitutional: Negative.  Negative for fatigue.  HENT: Negative.  Negative for sore throat.   Eyes: Negative.   Respiratory: Negative.  Negative for cough and shortness of breath.   Cardiovascular: Negative.  Negative for palpitations.  Gastrointestinal: Negative for abdominal pain, heartburn and nausea.  Endocrine: Negative.   Genitourinary: Negative.   Musculoskeletal: Negative.  Negative for muscle weakness.  Neurological: Negative.  Negative for focal weakness and headaches.  Hematological: Negative.   Psychiatric/Behavioral: Negative.   All other systems reviewed and are negative.      Objective:   Physical Exam  Constitutional: She is oriented to person, place, and time. She appears well-developed and well-nourished. No distress.  HENT:  Head: Normocephalic and atraumatic.  Right Ear: External ear normal.  Left Ear: External ear normal.  Nose: Nose normal.  Mouth/Throat: Oropharynx is clear and moist.  Eyes: Pupils are equal, round, and reactive to light.  Neck: Normal range of motion. Neck supple. No thyromegaly present.  Cardiovascular: Normal rate, regular rhythm, normal heart sounds and intact distal pulses.   No murmur heard. Pulmonary/Chest: Effort normal and breath sounds normal. No respiratory distress. She has no wheezes.  Abdominal: Soft. Bowel sounds are normal. She exhibits no distension. There is no tenderness.  Musculoskeletal: Normal range of motion. She exhibits no edema or tenderness.  Neurological: She is alert and oriented to person, place, and time. She has normal reflexes. No cranial nerve deficit.  Skin: Skin is warm and dry.  Psychiatric: She has a normal mood and affect. Her behavior is normal. Judgment and thought content normal.  Vitals reviewed.     BP 117/69   Pulse 67   Temp 97.9 F (36.6 C) (Oral)  Ht '5\' 3"'  (1.6 m)   Wt 167 lb 6.4 oz (75.9 kg)   LMP 08/13/2001   BMI 29.65 kg/m      Assessment &  Plan:  1. Essential hypertension - lisinopril-hydrochlorothiazide (PRINZIDE,ZESTORETIC) 20-12.5 MG tablet; TAKE 1 TABLET BY MOUTH 2 (TWO) TIMES DAILY.  Dispense: 180 tablet; Refill: 2 - CMP14+EGFR  2. Gastroesophageal reflux disease, esophagitis presence not specified - CMP14+EGFR  3. Hyperlipidemia - CMP14+EGFR - Lipid panel  4. Vitamin D deficiency - CMP14+EGFR - VITAMIN D 25 Hydroxy (Vit-D Deficiency, Fractures)  5. Overweight (BMI 25.0-29.9) - CMP14+EGFR  6. Colon cancer screening - Ambulatory referral to Gastroenterology - CMP14+EGFR  7. Encounter for screening for HIV - HIV antibody   Continue all meds Labs pending Health Maintenance reviewed Diet and exercise encouraged RTO 6 months  Evelina Dun, FNP

## 2015-10-18 ENCOUNTER — Ambulatory Visit: Payer: BLUE CROSS/BLUE SHIELD | Admitting: Family

## 2015-10-18 ENCOUNTER — Encounter (INDEPENDENT_AMBULATORY_CARE_PROVIDER_SITE_OTHER): Payer: Self-pay | Admitting: *Deleted

## 2015-10-18 LAB — LIPID PANEL
Chol/HDL Ratio: 1.8 ratio units (ref 0.0–4.4)
Cholesterol, Total: 168 mg/dL (ref 100–199)
HDL: 92 mg/dL (ref >39)
LDL Calculated: 67 mg/dL (ref 0–99)
Triglycerides: 46 mg/dL (ref 0–149)
VLDL Cholesterol Cal: 9 mg/dL (ref 5–40)

## 2015-10-18 LAB — CMP14+EGFR
ALT: 22 IU/L (ref 0–32)
AST: 22 IU/L (ref 0–40)
Albumin/Globulin Ratio: 1.5 (ref 1.2–2.2)
Albumin: 4.1 g/dL (ref 3.6–4.8)
Alkaline Phosphatase: 115 IU/L (ref 39–117)
BUN/Creatinine Ratio: 19 (ref 12–28)
BUN: 19 mg/dL (ref 8–27)
Bilirubin Total: 0.7 mg/dL (ref 0.0–1.2)
CO2: 25 mmol/L (ref 18–29)
Calcium: 10.1 mg/dL (ref 8.7–10.3)
Chloride: 97 mmol/L (ref 96–106)
Creatinine, Ser: 1 mg/dL (ref 0.57–1.00)
GFR calc Af Amer: 71 mL/min/{1.73_m2} (ref >59)
GFR calc non Af Amer: 61 mL/min/{1.73_m2} (ref >59)
Globulin, Total: 2.8 g/dL (ref 1.5–4.5)
Glucose: 92 mg/dL (ref 65–99)
Potassium: 3.9 mmol/L (ref 3.5–5.2)
Sodium: 141 mmol/L (ref 134–144)
Total Protein: 6.9 g/dL (ref 6.0–8.5)

## 2015-10-18 LAB — VITAMIN D 25 HYDROXY (VIT D DEFICIENCY, FRACTURES): Vit D, 25-Hydroxy: 49.3 ng/mL (ref 30.0–100.0)

## 2015-10-18 LAB — HIV ANTIBODY (ROUTINE TESTING W REFLEX): HIV Screen 4th Generation wRfx: NONREACTIVE

## 2015-10-19 ENCOUNTER — Ambulatory Visit: Payer: BLUE CROSS/BLUE SHIELD | Admitting: Family

## 2015-11-02 DIAGNOSIS — H26491 Other secondary cataract, right eye: Secondary | ICD-10-CM | POA: Diagnosis not present

## 2015-11-02 DIAGNOSIS — H26492 Other secondary cataract, left eye: Secondary | ICD-10-CM | POA: Diagnosis not present

## 2015-11-28 DIAGNOSIS — H2512 Age-related nuclear cataract, left eye: Secondary | ICD-10-CM | POA: Diagnosis not present

## 2015-12-12 DIAGNOSIS — H2512 Age-related nuclear cataract, left eye: Secondary | ICD-10-CM | POA: Diagnosis not present

## 2015-12-22 ENCOUNTER — Ambulatory Visit (INDEPENDENT_AMBULATORY_CARE_PROVIDER_SITE_OTHER): Payer: BLUE CROSS/BLUE SHIELD

## 2015-12-22 DIAGNOSIS — Z23 Encounter for immunization: Secondary | ICD-10-CM | POA: Diagnosis not present

## 2016-01-08 ENCOUNTER — Other Ambulatory Visit: Payer: Self-pay | Admitting: Family

## 2016-01-08 DIAGNOSIS — I10 Essential (primary) hypertension: Secondary | ICD-10-CM

## 2016-01-29 DIAGNOSIS — Z1231 Encounter for screening mammogram for malignant neoplasm of breast: Secondary | ICD-10-CM | POA: Diagnosis not present

## 2016-01-29 DIAGNOSIS — Z6831 Body mass index (BMI) 31.0-31.9, adult: Secondary | ICD-10-CM | POA: Diagnosis not present

## 2016-01-29 DIAGNOSIS — Z01419 Encounter for gynecological examination (general) (routine) without abnormal findings: Secondary | ICD-10-CM | POA: Diagnosis not present

## 2016-04-07 ENCOUNTER — Other Ambulatory Visit: Payer: Self-pay | Admitting: Family

## 2016-04-07 DIAGNOSIS — E785 Hyperlipidemia, unspecified: Secondary | ICD-10-CM

## 2016-04-07 DIAGNOSIS — K219 Gastro-esophageal reflux disease without esophagitis: Secondary | ICD-10-CM

## 2016-04-18 ENCOUNTER — Ambulatory Visit (INDEPENDENT_AMBULATORY_CARE_PROVIDER_SITE_OTHER): Payer: BLUE CROSS/BLUE SHIELD | Admitting: Family

## 2016-04-18 ENCOUNTER — Encounter: Payer: Self-pay | Admitting: Family

## 2016-04-18 VITALS — BP 110/68 | HR 70 | Temp 97.2°F | Ht 63.0 in | Wt 171.2 lb

## 2016-04-18 DIAGNOSIS — I10 Essential (primary) hypertension: Secondary | ICD-10-CM | POA: Diagnosis not present

## 2016-04-18 DIAGNOSIS — E785 Hyperlipidemia, unspecified: Secondary | ICD-10-CM

## 2016-04-18 DIAGNOSIS — Z1211 Encounter for screening for malignant neoplasm of colon: Secondary | ICD-10-CM | POA: Diagnosis not present

## 2016-04-18 DIAGNOSIS — E559 Vitamin D deficiency, unspecified: Secondary | ICD-10-CM

## 2016-04-18 DIAGNOSIS — K219 Gastro-esophageal reflux disease without esophagitis: Secondary | ICD-10-CM

## 2016-04-18 DIAGNOSIS — E663 Overweight: Secondary | ICD-10-CM | POA: Diagnosis not present

## 2016-04-18 MED ORDER — METOPROLOL SUCCINATE ER 100 MG PO TB24: 100.0000 mg | ORAL_TABLET | Freq: Every day | ORAL | 2 refills | Status: DC

## 2016-04-18 MED ORDER — OMEPRAZOLE 20 MG PO CPDR: 20.0000 mg | DELAYED_RELEASE_CAPSULE | Freq: Every day | ORAL | 2 refills | Status: DC

## 2016-04-18 MED ORDER — AMLODIPINE BESYLATE 10 MG PO TABS: 5.0000 mg | ORAL_TABLET | Freq: Every day | ORAL | 3 refills | Status: DC

## 2016-04-18 MED ORDER — ATORVASTATIN CALCIUM 80 MG PO TABS: 80.0000 mg | ORAL_TABLET | Freq: Every day | ORAL | 2 refills | Status: DC

## 2016-04-18 MED ORDER — LISINOPRIL-HYDROCHLOROTHIAZIDE 20-12.5 MG PO TABS: ORAL_TABLET | ORAL | 2 refills | Status: DC

## 2016-04-18 NOTE — Progress Notes (Signed)
Subjective:    Patient ID: Gina Murray, female    DOB: Jan 02, 1956, 61 y.o.   MRN: [id number]  Pt presents to the office today for chronic follow up.  Gastroesophageal Reflux  She reports no abdominal pain, no belching, no coughing, no heartburn, no nausea or no sore throat. This is a chronic problem. The current episode started more than 1 month ago. The problem occurs rarely. The problem has been resolved. The heartburn does not wake her from sleep. The symptoms are aggravated by certain foods. Pertinent negatives include no fatigue or muscle weakness. She has tried an antacid and a PPI for the symptoms. The treatment provided significant relief.  Hypertension  This is a chronic problem. The current episode started more than 1 year ago. The problem has been resolved since onset. The problem is controlled. Pertinent negatives include no anxiety, headaches, palpitations, peripheral edema or shortness of breath. Risk factors for coronary artery disease include dyslipidemia, post-menopausal state, obesity and family history. Past treatments include beta blockers, ACE inhibitors, calcium channel blockers and diuretics. The current treatment provides significant improvement. There is no history of kidney disease, CAD/MI, CVA or heart failure. There is no history of sleep apnea or a thyroid problem.  Hyperlipidemia  This is a chronic problem. The current episode started more than 1 year ago. The problem is controlled. Recent lipid tests were reviewed and are normal. Exacerbating diseases include obesity. She has no history of diabetes or hypothyroidism. Factors aggravating her hyperlipidemia include fatty foods. Pertinent negatives include no focal weakness, leg pain or shortness of breath. Current antihyperlipidemic treatment includes statins. The current treatment provides moderate improvement of lipids. Risk factors for coronary artery disease include dyslipidemia, hypertension, obesity and  post-menopausal.      Review of Systems  Constitutional: Negative.  Negative for fatigue.  HENT: Negative.  Negative for sore throat.   Eyes: Negative.   Respiratory: Negative.  Negative for cough and shortness of breath.   Cardiovascular: Negative.  Negative for palpitations.  Gastrointestinal: Negative for abdominal pain, heartburn and nausea.  Endocrine: Negative.   Genitourinary: Negative.   Musculoskeletal: Negative.  Negative for muscle weakness.  Neurological: Negative.  Negative for focal weakness and headaches.  Hematological: Negative.   Psychiatric/Behavioral: Negative.   All other systems reviewed and are negative.      Objective:   Physical Exam  Constitutional: She is oriented to person, place, and time. She appears well-developed and well-nourished. No distress.  HENT:  Head: Normocephalic and atraumatic.  Right Ear: External ear normal.  Left Ear: External ear normal.  Nose: Nose normal.  Mouth/Throat: Oropharynx is clear and moist.  Eyes: Pupils are equal, round, and reactive to light.  Neck: Normal range of motion. Neck supple. No thyromegaly present.  Cardiovascular: Normal rate, regular rhythm, normal heart sounds and intact distal pulses.   No murmur heard. Pulmonary/Chest: Effort normal and breath sounds normal. No respiratory distress. She has no wheezes.  Abdominal: Soft. Bowel sounds are normal. She exhibits no distension. There is no tenderness.  Musculoskeletal: Normal range of motion. She exhibits no edema or tenderness.  Neurological: She is alert and oriented to person, place, and time. She has normal reflexes. No cranial nerve deficit.  Skin: Skin is warm and dry.  Psychiatric: She has a normal mood and affect. Her behavior is normal. Judgment and thought content normal.  Vitals reviewed.     BP 110/68   Pulse 70   Temp 97.2 F (36.2 C) (Oral)  Ht '5\' 3"'  (1.6 m)   Wt 171 lb 3.2 oz (77.7 kg)   LMP 08/13/2001   BMI 30.33 kg/m        Assessment & Plan:  1. Essential hypertension - CMP14+EGFR  2. Gastroesophageal reflux disease, esophagitis presence not specified - CMP14+EGFR  3. Vitamin D deficiency - CMP14+EGFR  4. Overweight (BMI 25.0-29.9) - CMP14+EGFR  5. Hyperlipidemia, unspecified hyperlipidemia type - CMP14+EGFR - Lipid panel  6. Colon cancer screening  - Ambulatory referral to Gastroenterology - Fecal occult blood, imunochemical; Future - CMP14+EGFR   Continue all meds Labs pending Health Maintenance reviewed Diet and exercise encouraged RTO 6 months   Evelina Dun, FNP

## 2016-04-18 NOTE — Patient Instructions (Signed)
Colonoscopy, Adult A colonoscopy is an exam to look at the entire large intestine. During the exam, a lubricated, bendable tube is inserted into the anus and then passed into the rectum, colon, and other parts of the large intestine. A colonoscopy is often done as a part of normal colorectal screening or in response to certain symptoms, such as anemia, persistent diarrhea, abdominal pain, and blood in the stool. The exam can help screen for and diagnose medical problems, including:  Tumors.  Polyps.  Inflammation.  Areas of bleeding. Tell a health care provider about:  Any allergies you have.  All medicines you are taking, including vitamins, herbs, eye drops, creams, and over-the-counter medicines.  Any problems you or family members have had with anesthetic medicines.  Any blood disorders you have.  Any surgeries you have had.  Any medical conditions you have.  Any problems you have had passing stool. What are the risks? Generally, this is a safe procedure. However, problems may occur, including:  Bleeding.  A tear in the intestine.  A reaction to medicines given during the exam.  Infection (rare). What happens before the procedure? Eating and drinking restrictions  Follow instructions from your health care provider about eating and drinking, which may include:  A few days before the procedure - follow a low-fiber diet. Avoid nuts, seeds, dried fruit, raw fruits, and vegetables.  1-3 days before the procedure - follow a clear liquid diet. Drink only clear liquids, such as clear broth or bouillon, black coffee or tea, clear juice, clear soft drinks or sports drinks, gelatin desert, and popsicles. Avoid any liquids that contain red or purple dye.  On the day of the procedure - do not eat or drink anything during the 2 hours before the procedure, or within the time period that your health care provider recommends. Bowel prep  If you were prescribed an oral bowel prep to  clean out your colon:  Take it as told by your health care provider. Starting the day before your procedure, you will need to drink a large amount of medicated liquid. The liquid will cause you to have multiple loose stools until your stool is almost clear or light green.  If your skin or anus gets irritated from diarrhea, you may use these to relieve the irritation:  Medicated wipes, such as adult wet wipes with aloe and vitamin E.  A skin soothing-product like petroleum jelly.  If you vomit while drinking the bowel prep, take a break for up to 60 minutes and then begin the bowel prep again. If vomiting continues and you cannot take the bowel prep without vomiting, call your health care provider. General instructions  Ask your health care provider about changing or stopping your regular medicines. This is especially important if you are taking diabetes medicines or blood thinners.  Plan to have someone take you home from the hospital or clinic. What happens during the procedure?  An IV tube may be inserted into one of your veins.  You will be given medicine to help you relax (sedative).  To reduce your risk of infection:  Your health care team will wash or sanitize their hands.  Your anal area will be washed with soap.  You will be asked to lie on your side with your knees bent.  Your health care provider will lubricate a long, thin, flexible tube. The tube will have a camera and a light on the end.  The tube will be inserted into your anus.  The tube will be gently eased through your rectum and colon.  Air will be delivered into your colon to keep it open. You may feel some pressure or cramping.  The camera will be used to take images during the procedure.  A small tissue sample may be removed from your body to be examined under a microscope (biopsy). If any potential problems are found, the tissue will be sent to a lab for testing.  If small polyps are found, your health  care provider may remove them and have them checked for cancer cells.  The tube that was inserted into your anus will be slowly removed. The procedure may vary among health care providers and hospitals. What happens after the procedure?  Your blood pressure, heart rate, breathing rate, and blood oxygen level will be monitored until the medicines you were given have worn off.  Do not drive for 24 hours after the exam.  You may have a small amount of blood in your stool.  You may pass gas and have mild abdominal cramping or bloating due to the air that was used to inflate your colon during the exam.  It is up to you to get the results of your procedure. Ask your health care provider, or the department performing the procedure, when your results will be ready. This information is not intended to replace advice given to you by your health care provider. Make sure you discuss any questions you have with your health care provider. Document Released: 03/07/2000 Document Revised: 09/28/2015 Document Reviewed: 05/22/2015 Elsevier Interactive Patient Education  2017 [employer]  

## 2016-04-18 NOTE — Addendum Note (Signed)
Addended by: Evelina Dun A on: 04/18/2016 08:55 AM   Modules accepted: Orders

## 2016-04-19 LAB — CMP14+EGFR
ALT: 27 IU/L (ref 0–32)
AST: 26 IU/L (ref 0–40)
Albumin/Globulin Ratio: 1.7 (ref 1.2–2.2)
Albumin: 4.3 g/dL (ref 3.6–4.8)
Alkaline Phosphatase: 120 IU/L — ABNORMAL HIGH (ref 39–117)
BUN/Creatinine Ratio: 18 (ref 12–28)
BUN: 19 mg/dL (ref 8–27)
Bilirubin Total: 0.5 mg/dL (ref 0.0–1.2)
CO2: 26 mmol/L (ref 18–29)
Calcium: 9.7 mg/dL (ref 8.7–10.3)
Chloride: 101 mmol/L (ref 96–106)
Creatinine, Ser: 1.04 mg/dL — ABNORMAL HIGH (ref 0.57–1.00)
GFR calc Af Amer: 67 mL/min/{1.73_m2} (ref >59)
GFR calc non Af Amer: 59 mL/min/{1.73_m2} — ABNORMAL LOW (ref >59)
Globulin, Total: 2.6 g/dL (ref 1.5–4.5)
Glucose: 98 mg/dL (ref 65–99)
Potassium: 4 mmol/L (ref 3.5–5.2)
Sodium: 143 mmol/L (ref 134–144)
Total Protein: 6.9 g/dL (ref 6.0–8.5)

## 2016-04-19 LAB — LIPID PANEL
Chol/HDL Ratio: 2.3 ratio units (ref 0.0–4.4)
Cholesterol, Total: 167 mg/dL (ref 100–199)
HDL: 74 mg/dL (ref >39)
LDL Calculated: 84 mg/dL (ref 0–99)
Triglycerides: 47 mg/dL (ref 0–149)
VLDL Cholesterol Cal: 9 mg/dL (ref 5–40)

## 2016-04-22 ENCOUNTER — Other Ambulatory Visit: Payer: Self-pay | Admitting: Family

## 2016-04-22 DIAGNOSIS — I10 Essential (primary) hypertension: Secondary | ICD-10-CM

## 2016-04-22 MED ORDER — AMLODIPINE BESYLATE 10 MG PO TABS: 5.0000 mg | ORAL_TABLET | Freq: Every day | ORAL | 3 refills | Status: DC

## 2016-04-28 DIAGNOSIS — Z961 Presence of intraocular lens: Secondary | ICD-10-CM | POA: Diagnosis not present

## 2016-05-05 ENCOUNTER — Other Ambulatory Visit: Payer: BLUE CROSS/BLUE SHIELD

## 2016-05-05 DIAGNOSIS — Z1211 Encounter for screening for malignant neoplasm of colon: Secondary | ICD-10-CM

## 2016-05-06 LAB — FECAL OCCULT BLOOD, IMMUNOCHEMICAL: Fecal Occult Bld: NEGATIVE

## 2016-05-09 ENCOUNTER — Telehealth: Payer: Self-pay

## 2016-05-09 DIAGNOSIS — Z1211 Encounter for screening for malignant neoplasm of colon: Secondary | ICD-10-CM

## 2016-05-12 ENCOUNTER — Telehealth: Payer: Self-pay

## 2016-05-12 NOTE — Telephone Encounter (Signed)
Pt called and left Vm that she received the letter to schedule her colonoscopy. However, she will be going elsewhere to another GI doctor for this procedure and just called with FYI.

## 2016-05-12 NOTE — Telephone Encounter (Signed)
Can we see what referral patient wants?

## 2016-05-12 NOTE — Telephone Encounter (Signed)
Pt aware referral has been placed for Dr. Olevia Perches office

## 2016-05-12 NOTE — Addendum Note (Signed)
Addended by: Evelina Dun A on: 05/12/2016 12:55 PM   Modules accepted: Orders

## 2016-05-12 NOTE — Telephone Encounter (Signed)
GI referral placed

## 2016-05-13 ENCOUNTER — Encounter (INDEPENDENT_AMBULATORY_CARE_PROVIDER_SITE_OTHER): Payer: Self-pay | Admitting: *Deleted

## 2016-05-16 ENCOUNTER — Other Ambulatory Visit (INDEPENDENT_AMBULATORY_CARE_PROVIDER_SITE_OTHER): Payer: Self-pay | Admitting: *Deleted

## 2016-05-16 DIAGNOSIS — Z1211 Encounter for screening for malignant neoplasm of colon: Secondary | ICD-10-CM | POA: Insufficient documentation

## 2016-07-24 ENCOUNTER — Telehealth (INDEPENDENT_AMBULATORY_CARE_PROVIDER_SITE_OTHER): Payer: Self-pay | Admitting: *Deleted

## 2016-07-24 ENCOUNTER — Encounter (INDEPENDENT_AMBULATORY_CARE_PROVIDER_SITE_OTHER): Payer: Self-pay | Admitting: *Deleted

## 2016-07-24 MED ORDER — PEG 3350-KCL-NA BICARB-NACL 420 G PO SOLR: 4000.0000 mL | Freq: Once | ORAL | 0 refills | Status: AC

## 2016-07-24 NOTE — Telephone Encounter (Signed)
Patient needs trilyte 

## 2016-07-24 NOTE — Telephone Encounter (Signed)
Referring MD/PCP: christy hawks, np ([facility])   Procedure: tcs  Reason/Indication:  screening  Has patient had this procedure before?  Yes, over 10 yrs ago  If so, when, by whom and where?    Is there a family history of colon cancer?  no  Who?  What age when diagnosed?    Is patient diabetic?   no      Does patient have prosthetic heart valve or mechanical valve?  no  Do you have a pacemaker?  no  Has patient ever had endocarditis? no  Has patient had joint replacement within last 12 months?  no  Does patient tend to be constipated or take laxatives? no  Does patient have a history of alcohol/drug use?  no  Is patient on Coumadin, Plavix and/or Aspirin? yes  Medications: see epic  Allergies: see epic  Medication Adjustment per Dr Laural Golden: asa 2 days  Procedure date & time: 08/27/16 at 830

## 2016-07-24 NOTE — Telephone Encounter (Signed)
agree

## 2016-08-27 ENCOUNTER — Encounter (HOSPITAL_COMMUNITY)
Admission: RE | Disposition: A | Discharge status: Home Health | Payer: Self-pay | Source: Ambulatory Visit | Attending: Internal Medicine

## 2016-08-27 ENCOUNTER — Encounter (HOSPITAL_COMMUNITY): Payer: Self-pay | Admitting: *Deleted

## 2016-08-27 ENCOUNTER — Ambulatory Visit (HOSPITAL_COMMUNITY)
Admission: RE | Admit: 2016-08-27 | Discharge: 2016-08-27 | Disposition: A | Discharge status: Home Health | Payer: BLUE CROSS/BLUE SHIELD | Source: Ambulatory Visit | Attending: Internal Medicine | Admitting: Internal Medicine

## 2016-08-27 DIAGNOSIS — Z79899 Other long term (current) drug therapy: Secondary | ICD-10-CM | POA: Insufficient documentation

## 2016-08-27 DIAGNOSIS — D125 Benign neoplasm of sigmoid colon: Secondary | ICD-10-CM | POA: Diagnosis not present

## 2016-08-27 DIAGNOSIS — Z1211 Encounter for screening for malignant neoplasm of colon: Secondary | ICD-10-CM | POA: Diagnosis not present

## 2016-08-27 DIAGNOSIS — Z7982 Long term (current) use of aspirin: Secondary | ICD-10-CM | POA: Diagnosis not present

## 2016-08-27 DIAGNOSIS — E785 Hyperlipidemia, unspecified: Secondary | ICD-10-CM | POA: Insufficient documentation

## 2016-08-27 DIAGNOSIS — Z91040 Latex allergy status: Secondary | ICD-10-CM | POA: Diagnosis not present

## 2016-08-27 DIAGNOSIS — K6289 Other specified diseases of anus and rectum: Secondary | ICD-10-CM | POA: Insufficient documentation

## 2016-08-27 DIAGNOSIS — Z882 Allergy status to sulfonamides status: Secondary | ICD-10-CM | POA: Diagnosis not present

## 2016-08-27 DIAGNOSIS — Z880 Allergy status to penicillin: Secondary | ICD-10-CM | POA: Diagnosis not present

## 2016-08-27 DIAGNOSIS — Z8249 Family history of ischemic heart disease and other diseases of the circulatory system: Secondary | ICD-10-CM | POA: Diagnosis not present

## 2016-08-27 DIAGNOSIS — K219 Gastro-esophageal reflux disease without esophagitis: Secondary | ICD-10-CM | POA: Insufficient documentation

## 2016-08-27 DIAGNOSIS — E559 Vitamin D deficiency, unspecified: Secondary | ICD-10-CM | POA: Diagnosis not present

## 2016-08-27 DIAGNOSIS — I10 Essential (primary) hypertension: Secondary | ICD-10-CM | POA: Insufficient documentation

## 2016-08-27 DIAGNOSIS — K644 Residual hemorrhoidal skin tags: Secondary | ICD-10-CM | POA: Diagnosis not present

## 2016-08-27 HISTORY — PX: COLONOSCOPY: SHX5424

## 2016-08-27 SURGERY — COLONOSCOPY
Anesthesia: Moderate Sedation

## 2016-08-27 MED ORDER — SODIUM CHLORIDE 0.9 % IV SOLN
INTRAVENOUS | Status: DC
  Administered 2016-08-27: 1000 mL via INTRAVENOUS

## 2016-08-27 MED ORDER — STERILE WATER FOR IRRIGATION IR SOLN
Status: DC | PRN
  Administered 2016-08-27: 100 mL

## 2016-08-27 MED ORDER — MIDAZOLAM HCL 5 MG/5ML IJ SOLN
INTRAMUSCULAR | Status: AC
  Filled 2016-08-27: qty 10

## 2016-08-27 MED ORDER — MEPERIDINE HCL 50 MG/ML IJ SOLN
INTRAMUSCULAR | Status: DC | PRN
  Administered 2016-08-27 (×2): 25 mg via INTRAVENOUS

## 2016-08-27 MED ORDER — MEPERIDINE HCL 50 MG/ML IJ SOLN
INTRAMUSCULAR | Status: AC
  Filled 2016-08-27: qty 1

## 2016-08-27 MED ORDER — MIDAZOLAM HCL 5 MG/5ML IJ SOLN
INTRAMUSCULAR | Status: DC | PRN
  Administered 2016-08-27 (×2): 2 mg via INTRAVENOUS

## 2016-08-27 NOTE — Op Note (Signed)
[facility] Patient Name: Gina Murray Procedure Date: 08/27/2016 8:05 AM MRN: [id number] Date of Birth: 06-May-1955 Attending MD: Hildred Laser , MD CSN: [id number] Age: 61 Admit Type: Outpatient Procedure:                Colonoscopy Indications:              Screening for colorectal malignant neoplasm Providers:                Hildred Laser, MD, Rosina Lowenstein, RN, Aram Candela Referring MD:             Zigmund Gottron, FNP Medicines:                Meperidine 50 mg IV, Midazolam 4 mg IV Complications:            No immediate complications. Estimated Blood Loss:     Estimated blood loss was minimal. Procedure:                Pre-Anesthesia Assessment:                           - Prior to the procedure, a History and Physical                            was performed, and patient medications and                            allergies were reviewed. The patient's tolerance of                            previous anesthesia was also reviewed. The risks                            and benefits of the procedure and the sedation                            options and risks were discussed with the patient.                            All questions were answered, and informed consent                            was obtained. Prior Anticoagulants: The patient                            last took aspirin 6 days prior to the procedure.                            ASA Grade Assessment: II - A patient with mild                            systemic disease. After reviewing the risks and                            benefits, the patient was deemed in satisfactory  condition to undergo the procedure.                           After obtaining informed consent, the colonoscope                            was passed under direct vision. Throughout the                            procedure, the patient's blood pressure, pulse, and                            oxygen saturations were  monitored continuously. The                            EC-[id number] ([id number]) scope was introduced through                            the anus and advanced to the the cecum, identified                            by appendiceal orifice and ileocecal valve. The                            colonoscopy was performed without difficulty. The                            patient tolerated the procedure well. The quality                            of the bowel preparation was good. The ileocecal                            valve, appendiceal orifice, and rectum were                            photographed. Scope In: 8:24:05 AM Scope Out: 8:40:01 AM Scope Withdrawal Time: 0 hours 9 minutes 19 seconds  Total Procedure Duration: 0 hours 15 minutes 56 seconds  Findings:      The perianal and digital rectal examinations were normal.      A small polyp was found in the proximal sigmoid colon. The polyp was       sessile. Biopsies were taken with a cold forceps for histology.      The exam was otherwise normal throughout the examined colon.      External hemorrhoids were found during retroflexion. The hemorrhoids       were small.      Anal papilla(e) were hypertrophied. Impression:               - One small polyp in the proximal sigmoid colon.                            Biopsied.                           -  External hemorrhoids.                           - Anal papilla(e) were hypertrophied. Moderate Sedation:      Moderate (conscious) sedation was administered by the endoscopy nurse       and supervised by the endoscopist. The following parameters were       monitored: oxygen saturation, heart rate, blood pressure, CO2       capnography and response to care. Total physician intraservice time was       22 minutes. Recommendation:           - Patient has a contact number available for                            emergencies. The signs and symptoms of potential                            delayed  complications were discussed with the                            patient. Return to normal activities tomorrow.                            Written discharge instructions were provided to the                            patient.                           - Resume previous diet today.                           - Continue present medications.                           - Resume aspirin at prior dose tomorrow.                           - Await pathology results.                           - Repeat colonoscopy for surveillance based on                            pathology results. Procedure Code(s):        --- Professional ---                           [phone], Colonoscopy, flexible; with biopsy, single                            or multiple                           99152, Moderate sedation services provided by the  same physician or other qualified health care                            professional performing the diagnostic or                            therapeutic service that the sedation supports,                            requiring the presence of an independent trained                            observer to assist in the monitoring of the                            patient's level of consciousness and physiological                            status; initial 15 minutes of intraservice time,                            patient age 31 years or older Diagnosis Code(s):        --- Professional ---                           Z12.11, Encounter for screening for malignant                            neoplasm of colon                           D12.5, Benign neoplasm of sigmoid colon                           K62.89, Other specified diseases of anus and rectum                           K64.4, Residual hemorrhoidal skin tags CPT copyright 2016 [employer]. All rights reserved. The codes documented in this report are preliminary and upon coder review may  be  revised to meet current compliance requirements. Hildred Laser, MD Hildred Laser, MD 08/27/2016 8:48:26 AM This report has been signed electronically. Number of Addenda: 0

## 2016-08-27 NOTE — Discharge Instructions (Signed)
Resume aspirin on 08/28/2016. Resume other medications and usual diet. No driving for 24 hours. Physician will call with biopsy results.   Colonoscopy, Adult, Care After This sheet gives you information about how to care for yourself after your procedure. Your health care provider may also give you more specific instructions. If you have problems or questions, contact your health care provider. What can I expect after the procedure? After the procedure, it is common to have:  A small amount of blood in your stool for 24 hours after the procedure.  Some gas.  Mild abdominal cramping or bloating.  Follow these instructions at home: General instructions   For the first 24 hours after the procedure: ? Do not drive or use machinery. ? Do not sign important documents. ? Do not drink alcohol. ? Do your regular daily activities at a slower pace than normal. ? Eat soft, easy-to-digest foods. ? Rest often.  Take over-the-counter or prescription medicines only as told by your health care provider.  It is up to you to get the results of your procedure. Ask your health care provider, or the department performing the procedure, when your results will be ready. Relieving cramping and bloating  Try walking around when you have cramps or feel bloated.  Apply heat to your abdomen as told by your health care provider. Use a heat source that your health care provider recommends, such as a moist heat pack or a heating pad. ? Place a towel between your skin and the heat source. ? Leave the heat on for 20-30 minutes. ? Remove the heat if your skin turns bright red. This is especially important if you are unable to feel pain, heat, or cold. You may have a greater risk of getting burned. Eating and drinking  Drink enough fluid to keep your urine clear or pale yellow.  Resume your normal diet as instructed by your health care provider. Avoid heavy or fried foods that are hard to digest.  Avoid  drinking alcohol for as long as instructed by your health care provider. Contact a health care provider if:  You have blood in your stool 2-3 days after the procedure. Get help right away if:  You have more than a small spotting of blood in your stool.  You pass large blood clots in your stool.  Your abdomen is swollen.  You have nausea or vomiting.  You have a fever.  You have increasing abdominal pain that is not relieved with medicine. This information is not intended to replace advice given to you by your health care provider. Make sure you discuss any questions you have with your health care provider.     Colon Polyps Polyps are tissue growths inside the body. Polyps can grow in many places, including the large intestine (colon). A polyp may be a round bump or a mushroom-shaped growth. You could have one polyp or several. Most colon polyps are noncancerous (benign). However, some colon polyps can become cancerous over time. What are the causes? The exact cause of colon polyps is not known. What increases the risk? This condition is more likely to develop in people who:  Have a family history of colon cancer or colon polyps.  Are older than 41 or older than 45 if they are African American.  Have inflammatory bowel disease, such as ulcerative colitis or Crohn disease.  Are overweight.  Smoke cigarettes.  Do not get enough exercise.  Drink too much alcohol.  Eat a diet that is: ?  High in fat and red meat. ? Low in fiber.  Had childhood cancer that was treated with abdominal radiation.  What are the signs or symptoms? Most polyps do not cause symptoms. If you have symptoms, they may include:  Blood coming from your rectum when having a bowel movement.  Blood in your stool.The stool may look dark red or black.  A change in bowel habits, such as constipation or diarrhea.  How is this diagnosed? This condition is diagnosed with a colonoscopy. This is a  procedure that uses a lighted, flexible scope to look at the inside of your colon. How is this treated? Treatment for this condition involves removing any polyps that are found. Those polyps will then be tested for cancer. If cancer is found, your health care provider will talk to you about options for colon cancer treatment. Follow these instructions at home: Diet  Eat plenty of fiber, such as fruits, vegetables, and whole grains.  Eat foods that are high in calcium and vitamin D, such as milk, cheese, yogurt, eggs, liver, fish, and broccoli.  Limit foods high in fat, red meats, and processed meats, such as hot dogs, sausage, bacon, and lunch meats.  Maintain a healthy weight, or lose weight if recommended by your health care provider. General instructions  Do not smoke cigarettes.  Do not drink alcohol excessively.  Keep all follow-up visits as told by your health care provider. This is important. This includes keeping regularly scheduled colonoscopies. Talk to your health care provider about when you need a colonoscopy.  Exercise every day or as told by your health care provider. Contact a health care provider if:  You have new or worsening bleeding during a bowel movement.  You have new or increased blood in your stool.  You have a change in bowel habits.  You unexpectedly lose weight. This information is not intended to replace advice given to you by your health care provider. Make sure you discuss any questions you have with your health care provider.  Hemorrhoids Hemorrhoids are swollen veins in and around the rectum or anus. There are two types of hemorrhoids:  Internal hemorrhoids. These occur in the veins that are just inside the rectum. They may poke through to the outside and become irritated and painful.  External hemorrhoids. These occur in the veins that are outside of the anus and can be felt as a painful swelling or hard lump near the anus.  Most hemorrhoids  do not cause serious problems, and they can be managed with home treatments such as diet and lifestyle changes. If home treatments do not help your symptoms, procedures can be done to shrink or remove the hemorrhoids. What are the causes? This condition is caused by increased pressure in the anal area. This pressure may result from various things, including:  Constipation.  Straining to have a bowel movement.  Diarrhea.  Pregnancy.  Obesity.  Sitting for long periods of time.  Heavy lifting or other activity that causes you to strain.  Anal sex.  What are the signs or symptoms? Symptoms of this condition include:  Pain.  Anal itching or irritation.  Rectal bleeding.  Leakage of stool (feces).  Anal swelling.  One or more lumps around the anus.  How is this diagnosed? This condition can often be diagnosed through a visual exam. Other exams or tests may also be done, such as:  Examination of the rectal area with a gloved hand (digital rectal exam).  Examination of the  anal canal using a small tube (anoscope).  A blood test, if you have lost a significant amount of blood.  A test to look inside the colon (sigmoidoscopy or colonoscopy).  How is this treated? This condition can usually be treated at home. However, various procedures may be done if dietary changes, lifestyle changes, and other home treatments do not help your symptoms. These procedures can help make the hemorrhoids smaller or remove them completely. Some of these procedures involve surgery, and others do not. Common procedures include:  Rubber band ligation. Rubber bands are placed at the base of the hemorrhoids to cut off the blood supply to them.  Sclerotherapy. Medicine is injected into the hemorrhoids to shrink them.  Infrared coagulation. A type of light energy is used to get rid of the hemorrhoids.  Hemorrhoidectomy surgery. The hemorrhoids are surgically removed, and the veins that supply them  are tied off.  Stapled hemorrhoidopexy surgery. A circular stapling device is used to remove the hemorrhoids and use staples to cut off the blood supply to them.  Follow these instructions at home: Eating and drinking  Eat foods that have a lot of fiber in them, such as whole grains, beans, nuts, fruits, and vegetables. Ask your health care provider about taking products that have added fiber (fiber supplements).  Drink enough fluid to keep your urine clear or pale yellow. Managing pain and swelling  Take warm sitz baths for 20 minutes, 3-4 times a day to ease pain and discomfort.  If directed, apply ice to the affected area. Using ice packs between sitz baths may be helpful. ? Put ice in a plastic bag. ? Place a towel between your skin and the bag. ? Leave the ice on for 20 minutes, 2-3 times a day. General instructions  Take over-the-counter and prescription medicines only as told by your health care provider.  Use medicated creams or suppositories as told.  Exercise regularly.  Go to the bathroom when you have the urge to have a bowel movement. Do not wait.  Avoid straining to have bowel movements.  Keep the anal area dry and clean. Use wet toilet paper or moist towelettes after a bowel movement.  Do not sit on the toilet for long periods of time. This increases blood pooling and pain. Contact a health care provider if:  You have increasing pain and swelling that are not controlled by treatment or medicine.  You have uncontrolled bleeding.  You have difficulty having a bowel movement, or you are unable to have a bowel movement.  You have pain or inflammation outside the area of the hemorrhoids. This information is not intended to replace advice given to you by your health care provider. Make sure you discuss any questions you have with your health care provider.

## 2016-08-27 NOTE — H&P (Signed)
Gina Murray is an 61 y.o. female.   Chief Complaint: Patient is here for colonoscopy. HPI: Patient is 61 year old Caucasian female who is here for screening colonoscopy. Last exam was 11 years ago revealing hemorrhoids. She denies abdominal pain change in bowel habits or rectal bleeding. Family history is negative for CRC.   Past Medical History:  Diagnosis Date  . GERD (gastroesophageal reflux disease) 04/22/2013  . Hyperlipidemia   . Hypertension   . Multiple skin nodules   . Vitamin D deficiency     Past Surgical History:  Procedure Laterality Date  . CATARACT EXTRACTION W/PHACO Right 01/30/2014   Procedure: CATARACT EXTRACTION PHACO AND INTRAOCULAR LENS PLACEMENT ([facility]);  Surgeon: Tonny Branch, MD;  Location: [facility];  Service: Ophthalmology;  Laterality: Right;  CDE 13.53  . CESAREAN SECTION      Family History  Problem Relation Age of Onset  . Hypertension Mother   . Heart disease Mother 32  . Atrial fibrillation Mother   . Kidney disease Father        Dialysis  . Lung cancer Sister    Social History:  reports that she has never smoked. She has never used smokeless tobacco. She reports that she does not drink alcohol or use drugs.  Allergies:  Allergies  Allergen Reactions  . Latex     Had hand rash yrs ago and was told "could be allergic " but pt states "not a definite thing"  . Penicillins Rash    Has patient had a PCN reaction causing immediate rash, facial/tongue/throat swelling, SOB or lightheadedness with hypotension: Yes Has patient had a PCN reaction causing severe rash involving mucus membranes or skin necrosis: Unknown Has patient had a PCN reaction that required hospitalization: No Has patient had a PCN reaction occurring within the last 10 years: No If all of the above answers are "NO", then may proceed with Cephalosporin use.   . Sulfa Antibiotics Rash    Medications Prior to Admission  Medication Sig Dispense Refill  . amLODipine (NORVASC) 10 MG  tablet Take 0.5 tablets (5 mg total) by mouth daily. 45 tablet 3  . aspirin EC 81 MG tablet Take 81 mg by mouth daily.    Marland Kitchen atorvastatin (LIPITOR) 80 MG tablet Take 1 tablet (80 mg total) by mouth daily. 90 tablet 2  . Calcium Carbonate-Vitamin D (CALCIUM 600+D PO) Take 2 tablets by mouth daily.    . Cholecalciferol (VITAMIN D) 2000 UNITS tablet Take 2,000 Units by mouth daily.    . Hypromellose (ARTIFICIAL TEARS OP) Place 1-2 drops into both eyes 2 (two) times daily as needed (dry eyes).    Marland Kitchen lisinopril-hydrochlorothiazide (PRINZIDE,ZESTORETIC) 20-12.5 MG tablet TAKE 1 TABLET BY MOUTH 2 (TWO) TIMES DAILY. 180 tablet 2  . metoprolol succinate (TOPROL-XL) 100 MG 24 hr tablet Take 1 tablet (100 mg total) by mouth daily. Take with or immediately following a meal. (Patient taking differently: Take 100 mg by mouth at bedtime. Take with or immediately following a meal.) 90 tablet 2  . omeprazole (PRILOSEC) 20 MG capsule Take 1 capsule (20 mg total) by mouth daily. 90 capsule 2    No results found for this or any previous visit (from the past 48 hour(s)). No results found.  ROS  Blood pressure 129/68, pulse 65, temperature 98.4 F (36.9 C), temperature source Oral, resp. rate 10, height 5\' 3"  (1.6 m), weight 177 lb (80.3 kg), last menstrual period 08/13/2001, SpO2 100 %. Physical Exam  Constitutional: She appears well-developed and  well-nourished.  HENT:  Mouth/Throat: Oropharynx is clear and moist.  Eyes: Conjunctivae are normal. No scleral icterus.  Neck: No thyromegaly present.  Cardiovascular: Normal rate, regular rhythm and normal heart sounds.   No murmur heard. Respiratory: Effort normal and breath sounds normal.  GI: Soft. She exhibits no distension and no mass. There is no tenderness.  Musculoskeletal: She exhibits no edema.  Lymphadenopathy:    She has no cervical adenopathy.  Neurological: She is alert.  Skin: Skin is warm and dry.     Assessment/Plan Average risk screening  colonoscopy.  Hildred Laser, MD 08/27/2016, 8:14 AM

## 2016-09-03 ENCOUNTER — Encounter (HOSPITAL_COMMUNITY): Payer: Self-pay | Admitting: Internal Medicine

## 2016-10-17 ENCOUNTER — Encounter: Payer: Self-pay | Admitting: Family

## 2016-10-17 ENCOUNTER — Ambulatory Visit (INDEPENDENT_AMBULATORY_CARE_PROVIDER_SITE_OTHER): Payer: BLUE CROSS/BLUE SHIELD | Admitting: Family

## 2016-10-17 VITALS — BP 120/74 | HR 60 | Temp 97.9°F | Ht 63.0 in | Wt 179.0 lb

## 2016-10-17 DIAGNOSIS — E785 Hyperlipidemia, unspecified: Secondary | ICD-10-CM

## 2016-10-17 DIAGNOSIS — K219 Gastro-esophageal reflux disease without esophagitis: Secondary | ICD-10-CM | POA: Diagnosis not present

## 2016-10-17 DIAGNOSIS — E669 Obesity, unspecified: Secondary | ICD-10-CM

## 2016-10-17 DIAGNOSIS — I10 Essential (primary) hypertension: Secondary | ICD-10-CM

## 2016-10-17 MED ORDER — BETAMETHASONE DIPROPIONATE 0.05 % EX CREA: TOPICAL_CREAM | Freq: Two times a day (BID) | CUTANEOUS | 0 refills | Status: DC

## 2016-10-17 NOTE — Patient Instructions (Signed)

## 2016-10-17 NOTE — Progress Notes (Signed)
   Subjective:    Patient ID: Gina Murray, female    DOB: 08-Sep-1955, 61 y.o.   MRN: [id number]  Pt presents to the office today for chronic follow up. Hypertension  This is a chronic problem. The current episode started more than 1 year ago. The problem has been resolved since onset. The problem is controlled. Pertinent negatives include no headaches, malaise/fatigue, peripheral edema or shortness of breath. Risk factors for coronary artery disease include dyslipidemia, obesity and sedentary lifestyle. The current treatment provides moderate improvement. There is no history of kidney disease, CAD/MI or heart failure.  Gastroesophageal Reflux  She reports no belching, no coughing or no heartburn. This is a chronic problem. The current episode started more than 1 year ago. The problem occurs rarely. The symptoms are aggravated by certain foods. Risk factors include obesity. She has tried a PPI for the symptoms. The treatment provided moderate relief.  Hyperlipidemia  This is a chronic problem. The current episode started more than 1 year ago. The problem is controlled. Recent lipid tests were reviewed and are normal. Exacerbating diseases include obesity. Pertinent negatives include no shortness of breath. Current antihyperlipidemic treatment includes statins. The current treatment provides moderate improvement of lipids. Risk factors for coronary artery disease include dyslipidemia, hypertension, post-menopausal and a sedentary lifestyle.      Review of Systems  Constitutional: Negative for malaise/fatigue.  Respiratory: Negative for cough and shortness of breath.   Gastrointestinal: Negative for heartburn.  Neurological: Negative for headaches.  All other systems reviewed and are negative.      Objective:   Physical Exam  Constitutional: She is oriented to person, place, and time. She appears well-developed and well-nourished. No distress.  HENT:  Head: Normocephalic and atraumatic.    Right Ear: External ear normal.  Left Ear: External ear normal.  Nose: Nose normal.  Mouth/Throat: Oropharynx is clear and moist.  Eyes: Pupils are equal, round, and reactive to light.  Neck: Normal range of motion. Neck supple. No thyromegaly present.  Cardiovascular: Normal rate, regular rhythm, normal heart sounds and intact distal pulses.   No murmur heard. Pulmonary/Chest: Effort normal and breath sounds normal. No respiratory distress. She has no wheezes.  Abdominal: Soft. Bowel sounds are normal. She exhibits no distension. There is no tenderness.  Musculoskeletal: Normal range of motion. She exhibits no edema or tenderness.  Neurological: She is alert and oriented to person, place, and time.  Skin: Skin is warm and dry.  Psychiatric: She has a normal mood and affect. Her behavior is normal. Judgment and thought content normal.  Vitals reviewed.    BP 120/74   Pulse 60   Temp 97.9 F (36.6 C) (Oral)   Ht '5\' 3"'$  (1.6 m)   Wt 179 lb (81.2 kg)   LMP 08/13/2001   BMI 31.71 kg/m      Assessment & Plan:  1. Essential hypertension - CMP14+EGFR  2. Gastroesophageal reflux disease, esophagitis presence not specified - CMP14+EGFR  3. Obesity (BMI 30-39.9) - CMP14+EGFR  4. Hyperlipidemia, unspecified hyperlipidemia type - CMP14+EGFR - Lipid panel   Continue all meds Labs pending Health Maintenance reviewed- PT to schedule pap in next few months Diet and exercise encouraged RTO 6 months   Evelina Dun, FNP

## 2016-10-18 LAB — CMP14+EGFR
ALT: 21 IU/L (ref 0–32)
AST: 23 IU/L (ref 0–40)
Albumin/Globulin Ratio: 1.6 (ref 1.2–2.2)
Albumin: 4.3 g/dL (ref 3.6–4.8)
Alkaline Phosphatase: 122 IU/L — ABNORMAL HIGH (ref 39–117)
BUN/Creatinine Ratio: 16 (ref 12–28)
BUN: 16 mg/dL (ref 8–27)
Bilirubin Total: 0.5 mg/dL (ref 0.0–1.2)
CO2: 28 mmol/L (ref 20–29)
Calcium: 10 mg/dL (ref 8.7–10.3)
Chloride: 99 mmol/L (ref 96–106)
Creatinine, Ser: 1 mg/dL (ref 0.57–1.00)
GFR calc Af Amer: 70 mL/min/{1.73_m2} (ref >59)
GFR calc non Af Amer: 61 mL/min/{1.73_m2} (ref >59)
Globulin, Total: 2.7 g/dL (ref 1.5–4.5)
Glucose: 95 mg/dL (ref 65–99)
Potassium: 4.5 mmol/L (ref 3.5–5.2)
Sodium: 142 mmol/L (ref 134–144)
Total Protein: 7 g/dL (ref 6.0–8.5)

## 2016-10-18 LAB — LIPID PANEL
Chol/HDL Ratio: 2.1 ratio (ref 0.0–4.4)
Cholesterol, Total: 163 mg/dL (ref 100–199)
HDL: 78 mg/dL (ref >39)
LDL Calculated: 73 mg/dL (ref 0–99)
Triglycerides: 58 mg/dL (ref 0–149)
VLDL Cholesterol Cal: 12 mg/dL (ref 5–40)

## 2016-10-20 ENCOUNTER — Other Ambulatory Visit: Payer: Self-pay | Admitting: Family

## 2016-10-20 DIAGNOSIS — I10 Essential (primary) hypertension: Secondary | ICD-10-CM

## 2016-12-18 ENCOUNTER — Other Ambulatory Visit: Payer: Self-pay | Admitting: Family

## 2016-12-18 DIAGNOSIS — I10 Essential (primary) hypertension: Secondary | ICD-10-CM

## 2017-01-06 ENCOUNTER — Ambulatory Visit (INDEPENDENT_AMBULATORY_CARE_PROVIDER_SITE_OTHER): Payer: BLUE CROSS/BLUE SHIELD

## 2017-01-06 DIAGNOSIS — Z23 Encounter for immunization: Secondary | ICD-10-CM

## 2017-01-23 ENCOUNTER — Ambulatory Visit: Payer: BLUE CROSS/BLUE SHIELD

## 2017-04-01 DIAGNOSIS — Z1231 Encounter for screening mammogram for malignant neoplasm of breast: Secondary | ICD-10-CM | POA: Diagnosis not present

## 2017-04-01 DIAGNOSIS — Z6833 Body mass index (BMI) 33.0-33.9, adult: Secondary | ICD-10-CM | POA: Diagnosis not present

## 2017-04-01 DIAGNOSIS — Z01419 Encounter for gynecological examination (general) (routine) without abnormal findings: Secondary | ICD-10-CM | POA: Diagnosis not present

## 2017-04-20 ENCOUNTER — Other Ambulatory Visit: Payer: Self-pay | Admitting: Family

## 2017-04-20 DIAGNOSIS — I10 Essential (primary) hypertension: Secondary | ICD-10-CM

## 2017-04-20 NOTE — Telephone Encounter (Signed)
Last seen 10/17/16  [facility]

## 2017-04-24 ENCOUNTER — Encounter: Payer: Self-pay | Admitting: Family

## 2017-04-24 ENCOUNTER — Ambulatory Visit: Payer: BLUE CROSS/BLUE SHIELD | Admitting: Family

## 2017-04-24 VITALS — BP 128/80 | HR 71 | Temp 98.0°F | Ht 62.0 in | Wt 182.4 lb

## 2017-04-24 DIAGNOSIS — K219 Gastro-esophageal reflux disease without esophagitis: Secondary | ICD-10-CM

## 2017-04-24 DIAGNOSIS — E785 Hyperlipidemia, unspecified: Secondary | ICD-10-CM

## 2017-04-24 DIAGNOSIS — Z23 Encounter for immunization: Secondary | ICD-10-CM

## 2017-04-24 DIAGNOSIS — E669 Obesity, unspecified: Secondary | ICD-10-CM

## 2017-04-24 DIAGNOSIS — I10 Essential (primary) hypertension: Secondary | ICD-10-CM | POA: Diagnosis not present

## 2017-04-24 NOTE — Progress Notes (Signed)
   Subjective:    Patient ID: Gina Murray, female    DOB: [birth year], 62 y.o.   MRN: [id number]  Pt presents to the office today for chronic follow up.  Hypertension  This is a chronic problem. The current episode started more than 1 year ago. The problem has been resolved since onset. The problem is controlled. Pertinent negatives include no chest pain, headaches, malaise/fatigue, peripheral edema or shortness of breath. Risk factors for coronary artery disease include dyslipidemia, obesity and sedentary lifestyle. The current treatment provides moderate improvement. There is no history of kidney disease, CAD/MI, CVA or heart failure.  Hyperlipidemia  This is a chronic problem. The current episode started more than 1 year ago. The problem is controlled. Recent lipid tests were reviewed and are normal. Exacerbating diseases include obesity. Pertinent negatives include no chest pain or shortness of breath. Current antihyperlipidemic treatment includes statins. The current treatment provides moderate improvement of lipids. Risk factors for coronary artery disease include dyslipidemia, obesity, a sedentary lifestyle and post-menopausal.  Gastroesophageal Reflux  She reports no chest pain, no coughing or no heartburn. This is a chronic problem. The current episode started more than 1 year ago. The problem occurs occasionally. The problem has been waxing and waning. Risk factors include obesity. She has tried a PPI for the symptoms. The treatment provided moderate relief.      Review of Systems  Constitutional: Negative for malaise/fatigue.  Respiratory: Negative for cough and shortness of breath.   Cardiovascular: Negative for chest pain.  Gastrointestinal: Negative for heartburn.  Neurological: Negative for headaches.  All other systems reviewed and are negative.      Objective:   Physical Exam  Constitutional: She is oriented to person, place, and time. She appears well-developed and  well-nourished. No distress.  HENT:  Head: Normocephalic and atraumatic.  Right Ear: External ear normal.  Left Ear: External ear normal.  Nose: Nose normal.  Mouth/Throat: Oropharynx is clear and moist.  Eyes: Pupils are equal, round, and reactive to light.  Neck: Normal range of motion. Neck supple. No thyromegaly present.  Cardiovascular: Normal rate, regular rhythm, normal heart sounds and intact distal pulses.  No murmur heard. Pulmonary/Chest: Effort normal and breath sounds normal. No respiratory distress. She has no wheezes.  Abdominal: Soft. Bowel sounds are normal. She exhibits no distension. There is no tenderness.  Musculoskeletal: Normal range of motion. She exhibits no edema or tenderness.  Neurological: She is alert and oriented to person, place, and time.  Skin: Skin is warm and dry.  Psychiatric: She has a normal mood and affect. Her behavior is normal. Judgment and thought content normal.  Vitals reviewed.     BP 128/80   Pulse 71   Temp 98 F (36.7 C) (Oral)   Ht '5\' 2"'$  (1.575 m)   Wt 182 lb 6.4 oz (82.7 kg)   LMP 08/13/2001   BMI 33.36 kg/m      Assessment & Plan:  1. Essential hypertension - CMP14+EGFR  2. Hyperlipidemia, unspecified hyperlipidemia type - CMP14+EGFR - Lipid panel  3. Gastroesophageal reflux disease, esophagitis presence not specified - CMP14+EGFR  4. Obesity (BMI 30-39.9) - CMP14+EGFR   Continue all meds Labs pending Health Maintenance reviewed Diet and exercise encouraged RTO 6 months   Evelina Dun, FNP

## 2017-04-24 NOTE — Addendum Note (Signed)
Addended by: Marin Olp on: 04/24/2017 11:59 AM   Modules accepted: Orders

## 2017-04-24 NOTE — Patient Instructions (Addendum)

## 2017-04-25 LAB — CMP14+EGFR
ALT: 29 IU/L (ref 0–32)
AST: 28 IU/L (ref 0–40)
Albumin/Globulin Ratio: 1.6 (ref 1.2–2.2)
Albumin: 4.2 g/dL (ref 3.6–4.8)
Alkaline Phosphatase: 122 IU/L — ABNORMAL HIGH (ref 39–117)
BUN/Creatinine Ratio: 24 (ref 12–28)
BUN: 24 mg/dL (ref 8–27)
Bilirubin Total: 0.4 mg/dL (ref 0.0–1.2)
CO2: 27 mmol/L (ref 20–29)
Calcium: 9.7 mg/dL (ref 8.7–10.3)
Chloride: 98 mmol/L (ref 96–106)
Creatinine, Ser: 1.01 mg/dL — ABNORMAL HIGH (ref 0.57–1.00)
GFR calc Af Amer: 69 mL/min/{1.73_m2} (ref >59)
GFR calc non Af Amer: 60 mL/min/{1.73_m2} (ref >59)
Globulin, Total: 2.7 g/dL (ref 1.5–4.5)
Glucose: 100 mg/dL — ABNORMAL HIGH (ref 65–99)
Potassium: 3.6 mmol/L (ref 3.5–5.2)
Sodium: 139 mmol/L (ref 134–144)
Total Protein: 6.9 g/dL (ref 6.0–8.5)

## 2017-04-25 LAB — LIPID PANEL
Chol/HDL Ratio: 2.1 ratio (ref 0.0–4.4)
Cholesterol, Total: 170 mg/dL (ref 100–199)
HDL: 82 mg/dL (ref >39)
LDL Calculated: 77 mg/dL (ref 0–99)
Triglycerides: 55 mg/dL (ref 0–149)
VLDL Cholesterol Cal: 11 mg/dL (ref 5–40)

## 2017-04-30 DIAGNOSIS — H524 Presbyopia: Secondary | ICD-10-CM | POA: Diagnosis not present

## 2017-05-09 ENCOUNTER — Encounter: Payer: Self-pay | Admitting: Family Medicine

## 2017-05-09 ENCOUNTER — Ambulatory Visit: Payer: BLUE CROSS/BLUE SHIELD | Admitting: Family Medicine

## 2017-05-09 VITALS — BP 130/71 | HR 86 | Temp 98.9°F | Ht 62.0 in | Wt 178.0 lb

## 2017-05-09 DIAGNOSIS — N3001 Acute cystitis with hematuria: Secondary | ICD-10-CM

## 2017-05-09 DIAGNOSIS — N3000 Acute cystitis without hematuria: Secondary | ICD-10-CM | POA: Diagnosis not present

## 2017-05-09 MED ORDER — NITROFURANTOIN MONOHYD MACRO 100 MG PO CAPS: 100.0000 mg | ORAL_CAPSULE | Freq: Two times a day (BID) | ORAL | 0 refills | Status: DC

## 2017-05-09 NOTE — Progress Notes (Signed)
BP 130/71   Pulse 86   Temp 98.9 F (37.2 C) (Oral)   Ht 5\' 2"  (1.575 m)   Wt 178 lb (80.7 kg)   LMP 08/13/2001   BMI 32.56 kg/m    Subjective:    Patient ID: Gina Murray, female    DOB: January 22, 1956, 62 y.o.   MRN: [id number]  HPI: Gina Murray is a 62 y.o. female presenting on 05/09/2017 for Urinary Tract Infection (dysuria, hematuria, frequency)   HPI Dysuria and hematuria and frequency Patient comes in today complaining of dysuria and hematuria and frequency that just started earlier this morning.  She denies any flank pain.  She did have some suprapubic abdominal pain.  She denies any pain radiating anywhere else.  She denies any vaginal symptoms that she knows of but she did not know if the blood was coming vaginally or from her urine.  She said it mostly did happen when she urinated.  She has been urinating frequently as well.  She has a fullness sensation in her bladder like she does not empty completely and some pain associated with that.  She denies fevers or chills.  The abdominal pain is mild  Relevant past medical, surgical, family and social history reviewed and updated as indicated. Interim medical history since our last visit reviewed. Allergies and medications reviewed and updated.  Review of Systems  Constitutional: Negative for chills and fever.  Eyes: Negative for visual disturbance.  Respiratory: Negative for chest tightness and shortness of breath.   Cardiovascular: Negative for chest pain and leg swelling.  Gastrointestinal: Positive for abdominal pain. Negative for abdominal distention, constipation and diarrhea.  Genitourinary: Positive for dysuria, frequency, hematuria and urgency. Negative for difficulty urinating, flank pain, pelvic pain, vaginal bleeding, vaginal discharge and vaginal pain.  Musculoskeletal: Negative for back pain and gait problem.  Skin: Negative for rash.  Neurological: Negative for light-headedness and headaches.    Psychiatric/Behavioral: Negative for agitation and behavioral problems.  All other systems reviewed and are negative.   Per HPI unless specifically indicated above        Objective:    BP 130/71   Pulse 86   Temp 98.9 F (37.2 C) (Oral)   Ht 5\' 2"  (1.575 m)   Wt 178 lb (80.7 kg)   LMP 08/13/2001   BMI 32.56 kg/m   Wt Readings from Last 3 Encounters:  05/09/17 178 lb (80.7 kg)  04/24/17 182 lb 6.4 oz (82.7 kg)  10/17/16 179 lb (81.2 kg)    Physical Exam  Constitutional: She is oriented to person, place, and time. She appears well-developed and well-nourished. No distress.  Eyes: Conjunctivae are normal.  Cardiovascular: Normal rate, regular rhythm, normal heart sounds and intact distal pulses.  No murmur heard. Pulmonary/Chest: Effort normal and breath sounds normal. No respiratory distress. She has no wheezes. She has no rales.  Abdominal: Soft. Bowel sounds are normal. She exhibits no distension. There is no tenderness. There is no rigidity, no rebound, no guarding and no CVA tenderness.  Neurological: She is alert and oriented to person, place, and time. Coordination normal.  Skin: Skin is warm and dry. No rash noted. She is not diaphoretic.  Psychiatric: She has a normal mood and affect. Her behavior is normal.  Nursing note and vitals reviewed.   Urinalysis: 3+ blood and nitrite positive and 1+ leukocytes    Assessment & Plan:   Problem List Items Addressed This Visit    None    Visit Diagnoses  Acute cystitis without hematuria    -  Primary   Relevant Medications   nitrofurantoin, macrocrystal-monohydrate, (MACROBID) 100 MG capsule   Other Relevant Orders   Urine Culture   Urinalysis       Follow up plan: Return if symptoms worsen or fail to improve.  Counseling provided for all of the vaccine components Orders Placed This Encounter  Procedures  . Urine Culture  . Urinalysis    Caryl Pina, MD [facility] 05/09/2017, 11:09 AM

## 2017-05-11 LAB — URINALYSIS
Bilirubin, UA: NEGATIVE
Glucose, UA: NEGATIVE
Nitrite, UA: POSITIVE — AB
Specific Gravity, UA: 1.02 (ref 1.005–1.030)
Urobilinogen, Ur: 2 mg/dL — ABNORMAL HIGH (ref 0.2–1.0)
pH, UA: 6.5 (ref 5.0–7.5)

## 2017-05-13 LAB — URINE CULTURE

## 2017-05-19 ENCOUNTER — Ambulatory Visit: Payer: BLUE CROSS/BLUE SHIELD | Admitting: Family Medicine

## 2017-05-19 ENCOUNTER — Encounter: Payer: Self-pay | Admitting: Family Medicine

## 2017-05-19 VITALS — BP 136/70 | HR 73 | Temp 97.0°F | Ht 62.0 in | Wt 177.4 lb

## 2017-05-19 DIAGNOSIS — L959 Vasculitis limited to the skin, unspecified: Secondary | ICD-10-CM

## 2017-05-19 DIAGNOSIS — L270 Generalized skin eruption due to drugs and medicaments taken internally: Secondary | ICD-10-CM | POA: Diagnosis not present

## 2017-05-19 MED ORDER — PREDNISONE 10 MG PO TABS
ORAL_TABLET | ORAL | 0 refills | Status: DC
Start: 2017-05-19 — End: 2017-11-16

## 2017-05-19 MED ORDER — LISINOPRIL 20 MG PO TABS: 20.0000 mg | ORAL_TABLET | Freq: Two times a day (BID) | ORAL | 1 refills | Status: DC

## 2017-05-19 NOTE — Progress Notes (Signed)
Chief Complaint  Patient presents with  . Rash    pt here today c/o "rash" on bilateral lower legs that doesn't itch    HPI  Patient presents today for new onset of rash.  He is never had anything like this before.  It just up on her legs in the last 2-3 days.  It is spread.  Is has not been associated with any itching or pain or swelling.  It has been spreading from the ankle area up toward the thighs and has been limited to both legs is symmetric.  She has had no systemic symptoms including fever chills sweats cough congestion or joint pain.  PMH: Smoking status noted ROS: Review of Systems  Constitutional: Negative for activity change, appetite change and fever.  HENT: Negative for congestion and sore throat.   Respiratory: Negative for cough and shortness of breath.   Cardiovascular: Negative for chest pain.  Gastrointestinal: Negative for abdominal pain, diarrhea and nausea.  Genitourinary: Negative.   Musculoskeletal: Negative for arthralgias and myalgias.     Objective: BP 136/70   Pulse 73   Temp (!) 97 F (36.1 C) (Oral)   Ht 5\' 2"  (1.575 m)   Wt 177 lb 6 oz (80.5 kg)   LMP 08/13/2001   BMI 32.44 kg/m  Gen: NAD, alert, cooperative with exam HEENT: NCAT, EOMI, PERRL CV: RRR, good S1/S2, no murmur Resp: CTABL, no wheezes, non-labored Abd: SNTND, BS present, no guarding or organomegaly Ext: No edema, warm.  This is a maculopapular eruption with some confluence near the ankles bilaterally.  There are small patches of blanching erythema with multiple petechial pinpoints within each.  See the photo below.  This is a representative area except that it is more confluent at this area and more scattered higher up on the calf and thigh. Neuro: Alert and oriented, No gross deficits    Assessment and plan:  1. 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 lisinopril (PRINIVIL,ZESTRIL) 20 MG tablet    Sig: Take 1 tablet (20  mg total) by mouth 2 (two) times daily.    Dispense:  180 tablet    Refill:  1  . predniSONE (DELTASONE) 10 MG tablet    Sig: Take 5 daily for 2 days followed by 4,3,2 and 1 for 2 days each.    Dispense:  30 tablet    Refill:  0    No orders of the defined types were placed in this encounter.   Follow up as needed.  Claretta Fraise, MD

## 2017-06-19 ENCOUNTER — Ambulatory Visit (INDEPENDENT_AMBULATORY_CARE_PROVIDER_SITE_OTHER): Payer: BLUE CROSS/BLUE SHIELD | Admitting: *Deleted

## 2017-06-19 DIAGNOSIS — Z23 Encounter for immunization: Secondary | ICD-10-CM

## 2017-06-19 NOTE — Progress Notes (Signed)
Pt given Shingrix 2nd vaccine Tolerated well

## 2017-07-03 ENCOUNTER — Other Ambulatory Visit: Payer: Self-pay | Admitting: Family

## 2017-07-03 DIAGNOSIS — K219 Gastro-esophageal reflux disease without esophagitis: Secondary | ICD-10-CM

## 2017-07-03 DIAGNOSIS — E785 Hyperlipidemia, unspecified: Secondary | ICD-10-CM

## 2017-07-16 ENCOUNTER — Other Ambulatory Visit: Payer: Self-pay | Admitting: Family

## 2017-07-16 DIAGNOSIS — I10 Essential (primary) hypertension: Secondary | ICD-10-CM

## 2017-07-17 ENCOUNTER — Telehealth: Payer: Self-pay | Admitting: Family

## 2017-07-17 NOTE — Telephone Encounter (Signed)
Please advise 

## 2017-07-17 NOTE — Telephone Encounter (Signed)
Left message on pt voicemail stating Alyse Low back in the office on Monday and will call pt back once this has been addressed.

## 2017-07-19 MED ORDER — AMLODIPINE BESYLATE 5 MG PO TABS: 5.0000 mg | ORAL_TABLET | Freq: Every day | ORAL | 3 refills | Status: DC

## 2017-07-19 NOTE — Telephone Encounter (Signed)
Prescription sent to pharmacy.

## 2017-09-24 ENCOUNTER — Other Ambulatory Visit: Payer: Self-pay | Admitting: Family

## 2017-09-24 DIAGNOSIS — K219 Gastro-esophageal reflux disease without esophagitis: Secondary | ICD-10-CM

## 2017-09-24 DIAGNOSIS — E785 Hyperlipidemia, unspecified: Secondary | ICD-10-CM

## 2017-10-12 ENCOUNTER — Other Ambulatory Visit: Payer: Self-pay | Admitting: Family

## 2017-10-12 ENCOUNTER — Other Ambulatory Visit: Payer: Self-pay | Admitting: Family Medicine

## 2017-10-12 DIAGNOSIS — I10 Essential (primary) hypertension: Secondary | ICD-10-CM

## 2017-10-12 NOTE — Telephone Encounter (Signed)
Ov 10/23/17

## 2017-10-23 ENCOUNTER — Encounter: Payer: Self-pay | Admitting: Family

## 2017-10-23 ENCOUNTER — Ambulatory Visit: Payer: BLUE CROSS/BLUE SHIELD | Admitting: Family

## 2017-10-23 VITALS — BP 155/81 | HR 63 | Temp 97.7°F | Ht 62.0 in | Wt 176.8 lb

## 2017-10-23 DIAGNOSIS — F321 Major depressive disorder, single episode, moderate: Secondary | ICD-10-CM

## 2017-10-23 DIAGNOSIS — Z0000 Encounter for general adult medical examination without abnormal findings: Secondary | ICD-10-CM

## 2017-10-23 DIAGNOSIS — E785 Hyperlipidemia, unspecified: Secondary | ICD-10-CM | POA: Diagnosis not present

## 2017-10-23 DIAGNOSIS — K219 Gastro-esophageal reflux disease without esophagitis: Secondary | ICD-10-CM | POA: Diagnosis not present

## 2017-10-23 DIAGNOSIS — E669 Obesity, unspecified: Secondary | ICD-10-CM

## 2017-10-23 DIAGNOSIS — L659 Nonscarring hair loss, unspecified: Secondary | ICD-10-CM | POA: Diagnosis not present

## 2017-10-23 DIAGNOSIS — I10 Essential (primary) hypertension: Secondary | ICD-10-CM

## 2017-10-23 DIAGNOSIS — R5383 Other fatigue: Secondary | ICD-10-CM

## 2017-10-23 MED ORDER — ESCITALOPRAM OXALATE 10 MG PO TABS: 10.0000 mg | ORAL_TABLET | Freq: Every day | ORAL | 3 refills | Status: DC

## 2017-10-23 NOTE — Patient Instructions (Signed)

## 2017-10-23 NOTE — Progress Notes (Signed)
Subjective:     Patient ID: Gina Murray, female    DOB: 04-Aug-1955, 63 y.o.   MRN: [id number]  Chief Complaint  Patient presents with  . Medical Management of Chronic Issues    six month recheck   Pt presents to the office today for CPE. Pt had her pap 04/01/17. Complaining fatigue after work, but states this may be from her job. States she has noticed an increase in hair loss.  Hypertension  This is a chronic problem. The current episode started more than 1 year ago. The problem has been waxing and waning since onset. The problem is uncontrolled. Pertinent negatives include no malaise/fatigue, peripheral edema or shortness of breath. Risk factors for coronary artery disease include dyslipidemia and obesity. There is no history of kidney disease, CAD/MI, CVA or heart failure.  Gastroesophageal Reflux  She reports no belching, no choking or no heartburn. This is a chronic problem. The current episode started more than 1 year ago. The problem occurs occasionally. The problem has been waxing and waning. She has tried a PPI for the symptoms. The treatment provided moderate relief.  Hyperlipidemia  This is a chronic problem. The current episode started more than 1 year ago. The problem is controlled. Recent lipid tests were reviewed and are normal. Exacerbating diseases include obesity. Pertinent negatives include no shortness of breath. Current antihyperlipidemic treatment includes statins. The current treatment provides moderate improvement of lipids. Risk factors for coronary artery disease include dyslipidemia, family history, hypertension and a sedentary lifestyle.  Depression         This is a new problem.  The current episode started more than 1 year ago.   The onset quality is sudden.   The problem occurs intermittently.  The problem has been waxing and waning since onset.  Associated symptoms include decreased interest and sad.  Associated symptoms include no helplessness, no hopelessness,  not irritable and no restlessness.  Past treatments include nothing.     Review of Systems  Constitutional: Negative for malaise/fatigue.  Respiratory: Negative for choking and shortness of breath.   Gastrointestinal: Negative for heartburn.  Psychiatric/Behavioral: Positive for depression.  All other systems reviewed and are negative.      Objective:   Physical Exam  Constitutional: She is oriented to person, place, and time. She appears well-developed and well-nourished. She is not irritable. No distress.  HENT:  Head: Normocephalic and atraumatic.  Right Ear: External ear normal.  Left Ear: External ear normal.  Mouth/Throat: Oropharynx is clear and moist.  Eyes: Pupils are equal, round, and reactive to light.  Neck: Normal range of motion. Neck supple. No thyromegaly present.  Cardiovascular: Normal rate, regular rhythm, normal heart sounds and intact distal pulses.  No murmur heard. Pulmonary/Chest: Effort normal and breath sounds normal. No respiratory distress. She has no wheezes.  Abdominal: Soft. Bowel sounds are normal. She exhibits no distension. There is no tenderness.  Musculoskeletal: Normal range of motion. She exhibits no edema or tenderness.  Neurological: She is alert and oriented to person, place, and time. She has normal reflexes. No cranial nerve deficit.  Skin: Skin is warm and dry.  Psychiatric: She has a normal mood and affect. Her behavior is normal. Judgment and thought content normal.  Vitals reviewed.     BP (!) 155/81   Pulse 63   Temp 97.7 F (36.5 C) (Oral)   Ht '5\' 2"'  (1.575 m)   Wt 176 lb 12.8 oz (80.2 kg)   LMP 08/13/2001  BMI 32.34 kg/m      Assessment & Plan:  Gina Murray comes in today with chief complaint of Annual Exam (six month recheck)   Diagnosis and orders addressed:  1. Annual physical exam - CMP14+EGFR - Lipid panel - TSH - Anemia Profile B  2. Essential hypertension - CMP14+EGFR  3. Gastroesophageal  reflux disease, esophagitis presence not specified - CMP14+EGFR  4. Obesity (BMI 30-39.9) - CMP14+EGFR  5. Hyperlipidemia, unspecified hyperlipidemia type - CMP14+EGFR - Lipid panel  6. Fatigue, unspecified type - CMP14+EGFR - TSH - Anemia Profile B  7. Hair loss - CMP14+EGFR - TSH  8. Current moderate episode of major depressive disorder without prior episode (Gina Murray) Will start Lexapro today Stress management discussed RTO in 6 weeks - escitalopram (LEXAPRO) 10 MG tablet; Take 1 tablet (10 mg total) by mouth daily.  Dispense: 90 tablet; Refill: 3   Labs pending Health Maintenance reviewed Diet and exercise encouraged  Follow up plan: 6 weeks to recheck depression    Evelina Dun, FNP

## 2017-10-24 LAB — ANEMIA PROFILE B
Basophils Absolute: 0.1 10*3/uL (ref 0.0–0.2)
Basos: 1 %
EOS (ABSOLUTE): 0.3 10*3/uL (ref 0.0–0.4)
Eos: 5 %
Ferritin: 30 ng/mL (ref 15–150)
Folate: 15 ng/mL (ref >3.0)
Hematocrit: 37.7 % (ref 34.0–46.6)
Hemoglobin: 12.5 g/dL (ref 11.1–15.9)
Immature Grans (Abs): 0 10*3/uL (ref 0.0–0.1)
Immature Granulocytes: 0 %
Iron Saturation: 21 % (ref 15–55)
Iron: 84 ug/dL (ref 27–139)
Lymphocytes Absolute: 2 10*3/uL (ref 0.7–3.1)
Lymphs: 34 %
MCH: 28.6 pg (ref 26.6–33.0)
MCHC: 33.2 g/dL (ref 31.5–35.7)
MCV: 86 fL (ref 79–97)
Monocytes Absolute: 0.6 10*3/uL (ref 0.1–0.9)
Monocytes: 10 %
Neutrophils Absolute: 3 10*3/uL (ref 1.4–7.0)
Neutrophils: 50 %
Platelets: 309 10*3/uL (ref 150–450)
RBC: 4.37 x10E6/uL (ref 3.77–5.28)
RDW: 16.2 % — ABNORMAL HIGH (ref 12.3–15.4)
Retic Ct Pct: 1.1 % (ref 0.6–2.6)
Total Iron Binding Capacity: 392 ug/dL (ref 250–450)
UIBC: 308 ug/dL (ref 118–369)
Vitamin B-12: 383 pg/mL (ref 232–1245)
WBC: 5.9 10*3/uL (ref 3.4–10.8)

## 2017-10-24 LAB — CMP14+EGFR
ALT: 29 IU/L (ref 0–32)
AST: 26 IU/L (ref 0–40)
Albumin/Globulin Ratio: 1.9 (ref 1.2–2.2)
Albumin: 4.5 g/dL (ref 3.6–4.8)
Alkaline Phosphatase: 120 IU/L — ABNORMAL HIGH (ref 39–117)
BUN/Creatinine Ratio: 18 (ref 12–28)
BUN: 16 mg/dL (ref 8–27)
Bilirubin Total: 0.7 mg/dL (ref 0.0–1.2)
CO2: 25 mmol/L (ref 20–29)
Calcium: 10.1 mg/dL (ref 8.7–10.3)
Chloride: 102 mmol/L (ref 96–106)
Creatinine, Ser: 0.9 mg/dL (ref 0.57–1.00)
GFR calc Af Amer: 79 mL/min/{1.73_m2} (ref >59)
GFR calc non Af Amer: 69 mL/min/{1.73_m2} (ref >59)
Globulin, Total: 2.4 g/dL (ref 1.5–4.5)
Glucose: 96 mg/dL (ref 65–99)
Potassium: 5.1 mmol/L (ref 3.5–5.2)
Sodium: 143 mmol/L (ref 134–144)
Total Protein: 6.9 g/dL (ref 6.0–8.5)

## 2017-10-24 LAB — LIPID PANEL
Chol/HDL Ratio: 2 ratio (ref 0.0–4.4)
Cholesterol, Total: 166 mg/dL (ref 100–199)
HDL: 82 mg/dL (ref >39)
LDL Calculated: 75 mg/dL (ref 0–99)
Triglycerides: 43 mg/dL (ref 0–149)
VLDL Cholesterol Cal: 9 mg/dL (ref 5–40)

## 2017-10-24 LAB — TSH: TSH: 1.24 u[IU]/mL (ref 0.450–4.500)

## 2017-10-26 ENCOUNTER — Telehealth: Payer: Self-pay | Admitting: Family

## 2017-10-26 MED ORDER — AMLODIPINE BESYLATE 10 MG PO TABS: 10.0000 mg | ORAL_TABLET | Freq: Every day | ORAL | 3 refills | Status: DC

## 2017-10-26 NOTE — Telephone Encounter (Signed)
BP high at 8/2 appt   She has been watching:  155/80-65 147/69-68 146/72-73 820/99-06 141/73-71 152/73-65 139/65-73 154/68-70 130/69-70 141/74-73 166/63-74 893/40-68 146/84-74 403/35-33 159/64-92

## 2017-10-26 NOTE — Telephone Encounter (Signed)
We will increase Norvasc 5 mg to 10 mg.  Report any increase swelling in her feet. Follow up in 2 weeks

## 2017-10-26 NOTE — Telephone Encounter (Signed)
Patient has a follow up appointment scheduled and aware of medication changes.

## 2017-11-09 ENCOUNTER — Other Ambulatory Visit: Payer: Self-pay | Admitting: Family Medicine

## 2017-11-13 ENCOUNTER — Ambulatory Visit: Payer: BLUE CROSS/BLUE SHIELD | Admitting: Family Medicine

## 2017-11-13 ENCOUNTER — Ambulatory Visit: Payer: BLUE CROSS/BLUE SHIELD | Admitting: Family

## 2017-11-13 ENCOUNTER — Encounter: Payer: Self-pay | Admitting: Family Medicine

## 2017-11-13 VITALS — BP 143/74 | HR 78 | Ht 62.0 in | Wt 172.2 lb

## 2017-11-13 DIAGNOSIS — I10 Essential (primary) hypertension: Secondary | ICD-10-CM | POA: Diagnosis not present

## 2017-11-13 NOTE — Patient Instructions (Signed)
DASH Eating Plan DASH stands for "Dietary Approaches to Stop Hypertension." The DASH eating plan is a healthy eating plan that has been shown to reduce high blood pressure (hypertension). It may also reduce your risk for type 2 diabetes, heart disease, and stroke. The DASH eating plan may also help with weight loss. What are tips for following this plan? General guidelines  Avoid eating more than 2,300 mg (milligrams) of salt (sodium) a day. If you have hypertension, you may need to reduce your sodium intake to 1,500 mg a day.  Limit alcohol intake to no more than 1 drink a day for nonpregnant women and 2 drinks a day for men. One drink equals 12 oz of beer, 5 oz of wine, or 1 oz of hard liquor.  Work with your health care provider to maintain a healthy body weight or to lose weight. Ask what an ideal weight is for you.  Get at least 30 minutes of exercise that causes your heart to beat faster (aerobic exercise) most days of the week. Activities may include walking, swimming, or biking.  Work with your health care provider or diet and nutrition specialist (dietitian) to adjust your eating plan to your individual calorie needs. Reading food labels  Check food labels for the amount of sodium per serving. Choose foods with less than 5 percent of the Daily Value of sodium. Generally, foods with less than 300 mg of sodium per serving fit into this eating plan.  To find whole grains, look for the word "whole" as the first word in the ingredient list. Shopping  Buy products labeled as "low-sodium" or "no salt added."  Buy fresh foods. Avoid canned foods and premade or frozen meals. Cooking  Avoid adding salt when cooking. Use salt-free seasonings or herbs instead of table salt or sea salt. Check with your health care provider or pharmacist before using salt substitutes.  Do not fry foods. Cook foods using healthy methods such as baking, boiling, grilling, and broiling instead.  Cook with  heart-healthy oils, such as olive, canola, soybean, or sunflower oil. Meal planning   Eat a balanced diet that includes: ? 5 or more servings of fruits and vegetables each day. At each meal, try to fill half of your plate with fruits and vegetables. ? Up to 6-8 servings of whole grains each day. ? Less than 6 oz of lean meat, poultry, or fish each day. A 3-oz serving of meat is about the same size as a deck of cards. One egg equals 1 oz. ? 2 servings of low-fat dairy each day. ? A serving of nuts, seeds, or beans 5 times each week. ? Heart-healthy fats. Healthy fats called Omega-3 fatty acids are found in foods such as flaxseeds and coldwater fish, like sardines, salmon, and mackerel.  Limit how much you eat of the following: ? Canned or prepackaged foods. ? Food that is high in trans fat, such as fried foods. ? Food that is high in saturated fat, such as fatty meat. ? Sweets, desserts, sugary drinks, and other foods with added sugar. ? Full-fat dairy products.  Do not salt foods before eating.  Try to eat at least 2 vegetarian meals each week.  Eat more home-cooked food and less restaurant, buffet, and fast food.  When eating at a restaurant, ask that your food be prepared with less salt or no salt, if possible. What foods are recommended? The items listed may not be a complete list. Talk with your dietitian about what   dietary choices are best for you. Grains Whole-grain or whole-wheat bread. Whole-grain or whole-wheat pasta. Brown rice. Oatmeal. Quinoa. Bulgur. Whole-grain and low-sodium cereals. Pita bread. Low-fat, low-sodium crackers. Whole-wheat flour tortillas. Vegetables Fresh or frozen vegetables (raw, steamed, roasted, or grilled). Low-sodium or reduced-sodium tomato and vegetable juice. Low-sodium or reduced-sodium tomato sauce and tomato paste. Low-sodium or reduced-sodium canned vegetables. Fruits All fresh, dried, or frozen fruit. Canned fruit in natural juice (without  added sugar). Meat and other protein foods Skinless chicken or turkey. Ground chicken or turkey. Pork with fat trimmed off. Fish and seafood. Egg whites. Dried beans, peas, or lentils. Unsalted nuts, nut butters, and seeds. Unsalted canned beans. Lean cuts of beef with fat trimmed off. Low-sodium, lean deli meat. Dairy Low-fat (1%) or fat-free (skim) milk. Fat-free, low-fat, or reduced-fat cheeses. Nonfat, low-sodium ricotta or cottage cheese. Low-fat or nonfat yogurt. Low-fat, low-sodium cheese. Fats and oils Soft margarine without trans fats. Vegetable oil. Low-fat, reduced-fat, or light mayonnaise and salad dressings (reduced-sodium). Canola, safflower, olive, soybean, and sunflower oils. Avocado. Seasoning and other foods Herbs. Spices. Seasoning mixes without salt. Unsalted popcorn and pretzels. Fat-free sweets. What foods are not recommended? The items listed may not be a complete list. Talk with your dietitian about what dietary choices are best for you. Grains Baked goods made with fat, such as croissants, muffins, or some breads. Dry pasta or rice meal packs. Vegetables Creamed or fried vegetables. Vegetables in a cheese sauce. Regular canned vegetables (not low-sodium or reduced-sodium). Regular canned tomato sauce and paste (not low-sodium or reduced-sodium). Regular tomato and vegetable juice (not low-sodium or reduced-sodium). Pickles. Olives. Fruits Canned fruit in a light or heavy syrup. Fried fruit. Fruit in cream or butter sauce. Meat and other protein foods Fatty cuts of meat. Ribs. Fried meat. Bacon. Sausage. Bologna and other processed lunch meats. Salami. Fatback. Hotdogs. Bratwurst. Salted nuts and seeds. Canned beans with added salt. Canned or smoked fish. Whole eggs or egg yolks. Chicken or turkey with skin. Dairy Whole or 2% milk, cream, and half-and-half. Whole or full-fat cream cheese. Whole-fat or sweetened yogurt. Full-fat cheese. Nondairy creamers. Whipped toppings.  Processed cheese and cheese spreads. Fats and oils Butter. Stick margarine. Lard. Shortening. Ghee. Bacon fat. Tropical oils, such as coconut, palm kernel, or palm oil. Seasoning and other foods Salted popcorn and pretzels. Onion salt, garlic salt, seasoned salt, table salt, and sea salt. Worcestershire sauce. Tartar sauce. Barbecue sauce. Teriyaki sauce. Soy sauce, including reduced-sodium. Steak sauce. Canned and packaged gravies. Fish sauce. Oyster sauce. Cocktail sauce. Horseradish that you find on the shelf. Ketchup. Mustard. Meat flavorings and tenderizers. Bouillon cubes. Hot sauce and Tabasco sauce. Premade or packaged marinades. Premade or packaged taco seasonings. Relishes. Regular salad dressings. Where to find more information:  National Heart, Lung, and Blood Institute: www.nhlbi.nih.gov  American Heart Association: www.heart.org Summary  The DASH eating plan is a healthy eating plan that has been shown to reduce high blood pressure (hypertension). It may also reduce your risk for type 2 diabetes, heart disease, and stroke.  With the DASH eating plan, you should limit salt (sodium) intake to 2,300 mg a day. If you have hypertension, you may need to reduce your sodium intake to 1,500 mg a day.  When on the DASH eating plan, aim to eat more fresh fruits and vegetables, whole grains, lean proteins, low-fat dairy, and heart-healthy fats.  Work with your health care provider or diet and nutrition specialist (dietitian) to adjust your eating plan to your individual   calorie needs. This information is not intended to replace advice given to you by your health care provider. Make sure you discuss any questions you have with your health care provider. Document Released: 02/27/2011 Document Revised: 03/03/2016 Document Reviewed: 03/03/2016 [employer] Interactive Patient Education  2018 [employer]  

## 2017-11-16 ENCOUNTER — Encounter: Payer: Self-pay | Admitting: Family Medicine

## 2017-11-16 NOTE — Progress Notes (Signed)
Subjective:  Patient ID: Gina Murray, female    DOB: [birth year]  Age: 62 y.o. MRN: [id number]  CC: Blood Pressure Check (pt here today for BP check after increasing Amlodipine to 10mg )   HPI Gina Murray presents for  follow-up of hypertension. Patient has no history of headache chest pain or shortness of breath or recent cough. Patient also denies symptoms of TIA such as focal numbness or weakness. Patient denies side effects from medication. States taking it regularly. Readings at home are good.   History Gina Murray has a past medical history of GERD (gastroesophageal reflux disease) (04/22/2013), Hyperlipidemia, Hypertension, Multiple skin nodules, and Vitamin D deficiency.   She has a past surgical history that includes Cesarean section; Cataract extraction w/PHACO (Right, 01/30/2014); and Colonoscopy (N/A, 08/27/2016).   Her family history includes Atrial fibrillation in her mother; Heart disease (age of onset: 22) in her mother; Hypertension in her mother; Kidney disease in her father; Lung cancer in her sister.She reports that she has never smoked. She has never used smokeless tobacco. She reports that she does not drink alcohol or use drugs.  Current Outpatient Medications on File Prior to Visit  Medication Sig Dispense Refill  . amLODipine (NORVASC) 10 MG tablet Take 1 tablet (10 mg total) by mouth daily. 90 tablet 3  . aspirin EC 81 MG tablet Take 1 tablet (81 mg total) by mouth daily.    Marland Kitchen atorvastatin (LIPITOR) 80 MG tablet TAKE 1 TABLET BY MOUTH ONCE DAILY 90 tablet 0  . betamethasone dipropionate (DIPROLENE) 0.05 % cream Apply topically 2 (two) times daily. 45 g 0  . Calcium Carbonate-Vitamin D (CALCIUM 600+D PO) Take 2 tablets by mouth daily.    . Cholecalciferol (VITAMIN D) 2000 UNITS tablet Take 2,000 Units by mouth daily.    Marland Kitchen escitalopram (LEXAPRO) 10 MG tablet Take 1 tablet (10 mg total) by mouth daily. 90 tablet 3  . Hypromellose (ARTIFICIAL TEARS OP) Place 1-2 drops into  both eyes 2 (two) times daily as needed (dry eyes).    Marland Kitchen lisinopril (PRINIVIL,ZESTRIL) 20 MG tablet TAKE 1 TABLET BY MOUTH TWICE A DAY 180 tablet 1  . metoprolol succinate (TOPROL-XL) 100 MG 24 hr tablet TAKE 1 TABLET BY MOUTH ONCE DAILY WITH OR IMMEDIATELY FOLLOWING A MEAL 90 tablet 0  . omeprazole (PRILOSEC) 20 MG capsule TAKE 1 CAPSULE BY MOUTH ONCE DAILY 90 capsule 0  . OVER THE COUNTER MEDICATION Take 1 capsule by mouth daily. Occuvite     No current facility-administered medications on file prior to visit.     ROS Review of Systems  Constitutional: Negative.   HENT: Negative.   Eyes: Negative for visual disturbance.  Respiratory: Negative for shortness of breath.   Cardiovascular: Negative for chest pain.  Gastrointestinal: Negative for abdominal pain.  Musculoskeletal: Negative for arthralgias.    Objective:  BP (!) 143/74   Pulse 78   Ht 5\' 2"  (1.575 m)   Wt 172 lb 4 oz (78.1 kg)   LMP 08/13/2001   BMI 31.50 kg/m   BP Readings from Last 3 Encounters:  11/13/17 (!) 143/74  10/23/17 (!) 155/81  05/19/17 136/70    Wt Readings from Last 3 Encounters:  11/13/17 172 lb 4 oz (78.1 kg)  10/23/17 176 lb 12.8 oz (80.2 kg)  05/19/17 177 lb 6 oz (80.5 kg)     Physical Exam  Constitutional: She is oriented to person, place, and time. She appears well-developed and well-nourished. No distress.  Cardiovascular: Normal  rate and regular rhythm.  Pulmonary/Chest: Breath sounds normal.  Neurological: She is alert and oriented to person, place, and time.  Skin: Skin is warm and dry.  Psychiatric: She has a normal mood and affect.      Assessment & Plan:   Gina Murray was seen today for blood pressure check.  Diagnoses and all orders for this visit:  Essential hypertension   Allergies as of 11/13/2017      Reactions   Latex    Had hand rash yrs ago and was told "could be allergic " but pt states "not a definite thing"   Hctz [hydrochlorothiazide] Rash   Penicillins Rash    Has patient had a PCN reaction causing immediate rash, facial/tongue/throat swelling, SOB or lightheadedness with hypotension: Yes Has patient had a PCN reaction causing severe rash involving mucus membranes or skin necrosis: Unknown Has patient had a PCN reaction that required hospitalization: No Has patient had a PCN reaction occurring within the last 10 years: No If all of the above answers are "NO", then may proceed with Cephalosporin use.   Sulfa Antibiotics Rash      Medication List        Accurate as of 11/13/17 11:59 PM. Always use your most recent med list.          amLODipine 10 MG tablet Commonly known as:  NORVASC Take 1 tablet (10 mg total) by mouth daily.   ARTIFICIAL TEARS OP Place 1-2 drops into both eyes 2 (two) times daily as needed (dry eyes).   aspirin EC 81 MG tablet Take 1 tablet (81 mg total) by mouth daily.   atorvastatin 80 MG tablet Commonly known as:  LIPITOR TAKE 1 TABLET BY MOUTH ONCE DAILY   betamethasone dipropionate 0.05 % cream Commonly known as:  DIPROLENE Apply topically 2 (two) times daily.   CALCIUM 600+D PO Take 2 tablets by mouth daily.   escitalopram 10 MG tablet Commonly known as:  LEXAPRO Take 1 tablet (10 mg total) by mouth daily.   lisinopril 20 MG tablet Commonly known as:  PRINIVIL,ZESTRIL TAKE 1 TABLET BY MOUTH TWICE A DAY   metoprolol succinate 100 MG 24 hr tablet Commonly known as:  TOPROL-XL TAKE 1 TABLET BY MOUTH ONCE DAILY WITH OR IMMEDIATELY FOLLOWING A MEAL   omeprazole 20 MG capsule Commonly known as:  PRILOSEC TAKE 1 CAPSULE BY MOUTH ONCE DAILY   OVER THE COUNTER MEDICATION Take 1 capsule by mouth daily. Occuvite   Vitamin D 2000 units tablet Take 2,000 Units by mouth daily.       No orders of the defined types were placed in this encounter.   Followed bby Ms. Hawks. Recheck of depression planned for next month  Follow-up: Return in about 6 months (around 05/16/2018), or if symptoms worsen or  fail to improve.  Gina Murray, M.D.

## 2017-12-11 ENCOUNTER — Encounter: Payer: Self-pay | Admitting: Family

## 2017-12-11 ENCOUNTER — Ambulatory Visit: Payer: BLUE CROSS/BLUE SHIELD | Admitting: Family

## 2017-12-11 VITALS — BP 127/68 | HR 69 | Temp 98.7°F | Ht 62.0 in | Wt 174.8 lb

## 2017-12-11 DIAGNOSIS — F320 Major depressive disorder, single episode, mild: Secondary | ICD-10-CM | POA: Insufficient documentation

## 2017-12-11 DIAGNOSIS — F325 Major depressive disorder, single episode, in full remission: Secondary | ICD-10-CM | POA: Insufficient documentation

## 2017-12-11 MED ORDER — ESCITALOPRAM OXALATE 20 MG PO TABS: 20.0000 mg | ORAL_TABLET | Freq: Every day | ORAL | 1 refills | Status: DC

## 2017-12-11 NOTE — Patient Instructions (Signed)
Persistent Depressive Disorder Persistent depressive disorder (PDD) is a mental health condition. PDD causes symptoms of low-level depression for 2 years or longer. It may also be called long-term (chronic) depression or dysthymia. PDD may include episodes of more severe depression that last for about 2 weeks (major depressive disorder or MDD). PDD can affect the way you think, feel, and sleep. This condition may also affect your relationships. You may be more likely to get sick if you have PDD. Symptoms of PDD occur for most of the day and may include:  Feeling tired (fatigue).  Low energy.  Eating too much or too little.  Sleeping too much or too little.  Feeling restless or agitated.  Feeling hopeless.  Feeling worthless or guilty.  Feeling worried or nervous (anxiety).  Trouble concentrating or making decisions.  Low self-esteem.  A negative way of looking at things (outlook).  Not being able to have fun or feel pleasure.  Avoiding interacting with people.  Getting angry or annoyed easily (irritability).  Acting aggressive or angry.  Follow these instructions at home: Activity  Go back to your normal activities as told by your doctor.  Exercise regularly as told by your doctor. General instructions  Take over-the-counter and prescription medicines only as told by your doctor.  Do not drink alcohol. Or, limit how much alcohol you drink to no more than 1 drink a day for nonpregnant women and 2 drinks a day for men. One drink equals 12 oz of beer, 5 oz of wine, or 1 oz of hard liquor. Alcohol can affect any antidepressant medicines you are taking. Talk with your doctor about your alcohol use.  Eat a healthy diet and get plenty of sleep.  Find activities that you enjoy each day.  Consider joining a support group. Your doctor may be able to suggest a support group.  Keep all follow-up visits as told by your doctor. This is important. Where to find more  information: National Alliance on Mental Illness  www.nami.org  U.S. National Institute of Mental Health  www.nimh.nih.gov  National Suicide Prevention Lifeline  1-800-273-TALK (1-800-273-8255). This is free, 24-hour help.  Contact a doctor if:  Your symptoms get worse.  You have new symptoms.  You have trouble sleeping or doing your daily activities. Get help right away if:  You self-harm.  You have serious thoughts about hurting yourself or others.  You see, hear, taste, smell, or feel things that are not there (hallucinate). This information is not intended to replace advice given to you by your health care provider. Make sure you discuss any questions you have with your health care provider. Document Released: 02/19/2015 Document Revised: 11/02/2015 Document Reviewed: 11/02/2015 Elsevier Interactive Patient Education  2017 Elsevier Inc.  

## 2017-12-11 NOTE — Progress Notes (Signed)
   Subjective:    Patient ID: Gina Murray, female    DOB: 09-22-1955, 62 y.o.   MRN: [id number]  Chief Complaint  Patient presents with  . Depression    6 week follow up on lexapro- Patient states she does not see any difference since taking the medication.   Pt presents to the office today to recheck GAD and Depression. PT was started on Lexapro 10 mg.  Depression         This is a chronic problem.  The current episode started more than 1 year ago.   The onset quality is gradual.   The problem occurs intermittently.  Associated symptoms include irritable, decreased interest and sad.  Associated symptoms include no helplessness and no hopelessness.  Past treatments include SSRIs - Selective serotonin reuptake inhibitors.     Review of Systems  Psychiatric/Behavioral: Positive for depression.  All other systems reviewed and are negative.      Objective:   Physical Exam  Constitutional: She is oriented to person, place, and time. She appears well-developed and well-nourished. She is irritable. No distress.  HENT:  Head: Normocephalic and atraumatic.  Right Ear: External ear normal.  Left Ear: External ear normal.  Mouth/Throat: Oropharynx is clear and moist.  Eyes: Pupils are equal, round, and reactive to light.  Neck: Normal range of motion. Neck supple. No thyromegaly present.  Cardiovascular: Normal rate, regular rhythm, normal heart sounds and intact distal pulses.  No murmur heard. Pulmonary/Chest: Effort normal and breath sounds normal. No respiratory distress. She has no wheezes.  Abdominal: Soft. Bowel sounds are normal. She exhibits no distension. There is no tenderness.  Musculoskeletal: Normal range of motion. She exhibits no edema or tenderness.  Neurological: She is alert and oriented to person, place, and time. She has normal reflexes. No cranial nerve deficit.  Skin: Skin is warm and dry.  Psychiatric: She has a normal mood and affect. Her behavior is normal.  Judgment and thought content normal.  Vitals reviewed.     BP 127/68   Pulse 69   Temp 98.7 F (37.1 C) (Oral)   Ht 5\' 2"  (1.575 m)   Wt 174 lb 12.8 oz (79.3 kg)   LMP 08/13/2001   BMI 31.97 kg/m      Assessment & Plan:  Gina Murray comes in today with chief complaint of Depression (6 week follow up on lexapro- Patient states she does not see any difference since taking the medication.)   Diagnosis and orders addressed:  1. Depression, major, single episode, mild (HCC) Will increase Lexapro to 20 mg from 10 mg Stress management discussed Pt will talk to her pastor instead of referral to [facility] in 6 weeks  - escitalopram (LEXAPRO) 20 MG tablet; Take 1 tablet (20 mg total) by mouth daily.  Dispense: 90 tablet; Refill: [place], FNP

## 2017-12-18 ENCOUNTER — Ambulatory Visit (INDEPENDENT_AMBULATORY_CARE_PROVIDER_SITE_OTHER): Payer: BLUE CROSS/BLUE SHIELD

## 2017-12-18 DIAGNOSIS — Z23 Encounter for immunization: Secondary | ICD-10-CM

## 2017-12-21 ENCOUNTER — Other Ambulatory Visit: Payer: Self-pay | Admitting: Family

## 2017-12-21 DIAGNOSIS — E785 Hyperlipidemia, unspecified: Secondary | ICD-10-CM

## 2017-12-21 DIAGNOSIS — K219 Gastro-esophageal reflux disease without esophagitis: Secondary | ICD-10-CM

## 2018-01-10 ENCOUNTER — Other Ambulatory Visit: Payer: Self-pay | Admitting: Family

## 2018-01-10 DIAGNOSIS — I10 Essential (primary) hypertension: Secondary | ICD-10-CM

## 2018-01-19 ENCOUNTER — Telehealth: Payer: Self-pay | Admitting: Family

## 2018-01-19 NOTE — Telephone Encounter (Signed)
Patient aware to take 20mg  x 1 week, 10mg  x 1 week, when at 5mg  go to every other day.

## 2018-01-22 ENCOUNTER — Ambulatory Visit: Payer: BLUE CROSS/BLUE SHIELD | Admitting: Family

## 2018-04-01 ENCOUNTER — Other Ambulatory Visit: Payer: Self-pay | Admitting: Family

## 2018-04-01 DIAGNOSIS — E785 Hyperlipidemia, unspecified: Secondary | ICD-10-CM

## 2018-04-22 DIAGNOSIS — Z6833 Body mass index (BMI) 33.0-33.9, adult: Secondary | ICD-10-CM | POA: Diagnosis not present

## 2018-04-22 DIAGNOSIS — Z01419 Encounter for gynecological examination (general) (routine) without abnormal findings: Secondary | ICD-10-CM | POA: Diagnosis not present

## 2018-04-22 DIAGNOSIS — Z1231 Encounter for screening mammogram for malignant neoplasm of breast: Secondary | ICD-10-CM | POA: Diagnosis not present

## 2018-05-11 ENCOUNTER — Other Ambulatory Visit: Payer: Self-pay | Admitting: Family

## 2018-05-14 ENCOUNTER — Ambulatory Visit: Payer: BLUE CROSS/BLUE SHIELD | Admitting: Family

## 2018-05-14 ENCOUNTER — Encounter: Payer: Self-pay | Admitting: Family

## 2018-05-14 VITALS — BP 132/81 | HR 72 | Temp 97.1°F | Ht 62.0 in | Wt 181.6 lb

## 2018-05-14 DIAGNOSIS — E669 Obesity, unspecified: Secondary | ICD-10-CM

## 2018-05-14 DIAGNOSIS — K219 Gastro-esophageal reflux disease without esophagitis: Secondary | ICD-10-CM | POA: Diagnosis not present

## 2018-05-14 DIAGNOSIS — F320 Major depressive disorder, single episode, mild: Secondary | ICD-10-CM

## 2018-05-14 DIAGNOSIS — I10 Essential (primary) hypertension: Secondary | ICD-10-CM | POA: Diagnosis not present

## 2018-05-14 DIAGNOSIS — E785 Hyperlipidemia, unspecified: Secondary | ICD-10-CM | POA: Diagnosis not present

## 2018-05-14 DIAGNOSIS — R609 Edema, unspecified: Secondary | ICD-10-CM

## 2018-05-14 LAB — CBC WITH DIFFERENTIAL/PLATELET
Basophils Absolute: 0.1 10*3/uL (ref 0.0–0.2)
Basos: 1 %
EOS (ABSOLUTE): 0.3 10*3/uL (ref 0.0–0.4)
Eos: 5 %
Hematocrit: 37.9 % (ref 34.0–46.6)
Hemoglobin: 12.4 g/dL (ref 11.1–15.9)
Immature Grans (Abs): 0 10*3/uL (ref 0.0–0.1)
Immature Granulocytes: 0 %
Lymphocytes Absolute: 2.1 10*3/uL (ref 0.7–3.1)
Lymphs: 34 %
MCH: 28.5 pg (ref 26.6–33.0)
MCHC: 32.7 g/dL (ref 31.5–35.7)
MCV: 87 fL (ref 79–97)
Monocytes Absolute: 0.6 10*3/uL (ref 0.1–0.9)
Monocytes: 10 %
Neutrophils Absolute: 3 10*3/uL (ref 1.4–7.0)
Neutrophils: 50 %
Platelets: 304 10*3/uL (ref 150–450)
RBC: 4.35 x10E6/uL (ref 3.77–5.28)
RDW: 13.6 % (ref 11.7–15.4)
WBC: 6 10*3/uL (ref 3.4–10.8)

## 2018-05-14 LAB — CMP14+EGFR
ALT: 24 IU/L (ref 0–32)
AST: 24 IU/L (ref 0–40)
Albumin/Globulin Ratio: 1.8 (ref 1.2–2.2)
Albumin: 4.4 g/dL (ref 3.8–4.8)
Alkaline Phosphatase: 140 IU/L — ABNORMAL HIGH (ref 39–117)
BUN/Creatinine Ratio: 18 (ref 12–28)
BUN: 17 mg/dL (ref 8–27)
Bilirubin Total: 0.6 mg/dL (ref 0.0–1.2)
CO2: 26 mmol/L (ref 20–29)
Calcium: 10.2 mg/dL (ref 8.7–10.3)
Chloride: 101 mmol/L (ref 96–106)
Creatinine, Ser: 0.97 mg/dL (ref 0.57–1.00)
GFR calc Af Amer: 72 mL/min/{1.73_m2} (ref >59)
GFR calc non Af Amer: 63 mL/min/{1.73_m2} (ref >59)
Globulin, Total: 2.4 g/dL (ref 1.5–4.5)
Glucose: 101 mg/dL — ABNORMAL HIGH (ref 65–99)
Potassium: 4.8 mmol/L (ref 3.5–5.2)
Sodium: 143 mmol/L (ref 134–144)
Total Protein: 6.8 g/dL (ref 6.0–8.5)

## 2018-05-14 MED ORDER — METOPROLOL SUCCINATE ER 100 MG PO TB24: ORAL_TABLET | ORAL | 3 refills | Status: DC

## 2018-05-14 MED ORDER — OMEPRAZOLE 20 MG PO CPDR: 20.0000 mg | DELAYED_RELEASE_CAPSULE | Freq: Every day | ORAL | 3 refills | Status: DC

## 2018-05-14 MED ORDER — AMLODIPINE BESYLATE 10 MG PO TABS: 10.0000 mg | ORAL_TABLET | Freq: Every day | ORAL | 3 refills | Status: DC

## 2018-05-14 MED ORDER — ATORVASTATIN CALCIUM 80 MG PO TABS: 80.0000 mg | ORAL_TABLET | Freq: Every day | ORAL | 3 refills | Status: DC

## 2018-05-14 MED ORDER — LISINOPRIL 20 MG PO TABS: 20.0000 mg | ORAL_TABLET | Freq: Two times a day (BID) | ORAL | 3 refills | Status: DC

## 2018-05-14 NOTE — Progress Notes (Signed)
Subjective:    Patient ID: Gina Murray, female    DOB: [birth year], 63 y.o.   MRN: [id number]  Chief Complaint  Patient presents with  . Medical Management of Chronic Issues    six month recheck   Pt presents to the office today for chronic follow up.  Hypertension  This is a chronic problem. The current episode started more than 1 year ago. The problem has been resolved since onset. The problem is controlled. Associated symptoms include peripheral edema ("at times"). Pertinent negatives include no headaches, malaise/fatigue or shortness of breath. Risk factors for coronary artery disease include dyslipidemia. The current treatment provides moderate improvement. There is no history of kidney disease, CAD/MI, CVA or heart failure.  Gastroesophageal Reflux  She reports no belching, no coughing or no heartburn. This is a chronic problem. The current episode started more than 1 year ago. The problem occurs occasionally. The problem has been waxing and waning. She has tried a PPI for the symptoms. The treatment provided moderate relief.  Hyperlipidemia  This is a chronic problem. The current episode started more than 1 year ago. The problem is controlled. Recent lipid tests were reviewed and are normal. Pertinent negatives include no shortness of breath. Current antihyperlipidemic treatment includes statins. The current treatment provides moderate improvement of lipids. Risk factors for coronary artery disease include dyslipidemia, hypertension, a sedentary lifestyle and post-menopausal.  Depression         This is a chronic problem.  The current episode started more than 1 year ago.   The onset quality is gradual.   The problem occurs intermittently.  Associated symptoms include decreased interest and sad.  Associated symptoms include no hopelessness and no headaches.  Past treatments include nothing.     Review of Systems  Constitutional: Negative for malaise/fatigue.  Respiratory: Negative  for cough and shortness of breath.   Gastrointestinal: Negative for heartburn.  Neurological: Negative for headaches.  Psychiatric/Behavioral: Positive for depression.  All other systems reviewed and are negative.      Objective:   Physical Exam Vitals signs reviewed.  Constitutional:      General: She is not in acute distress.    Appearance: She is well-developed.  HENT:     Head: Normocephalic and atraumatic.     Right Ear: Tympanic membrane normal.     Left Ear: Tympanic membrane normal.  Eyes:     Pupils: Pupils are equal, round, and reactive to light.  Neck:     Musculoskeletal: Normal range of motion and neck supple.     Thyroid: No thyromegaly.  Cardiovascular:     Rate and Rhythm: Normal rate and regular rhythm.     Heart sounds: Normal heart sounds. No murmur.  Pulmonary:     Effort: Pulmonary effort is normal. No respiratory distress.     Breath sounds: Normal breath sounds. No wheezing.  Abdominal:     General: Bowel sounds are normal. There is no distension.     Palpations: Abdomen is soft.     Tenderness: There is no abdominal tenderness.  Musculoskeletal: Normal range of motion.        General: No tenderness.     Right lower leg: No edema (none on exam today).     Left lower leg: No edema (none on exam today).  Skin:    General: Skin is warm and dry.  Neurological:     Mental Status: She is alert and oriented to person, place, and time.  Cranial Nerves: No cranial nerve deficit.     Deep Tendon Reflexes: Reflexes are normal and symmetric.  Psychiatric:        Behavior: Behavior normal.        Thought Content: Thought content normal.        Judgment: Judgment normal.       BP 132/81   Pulse 72   Temp (!) 97.1 F (36.2 C) (Oral)   Ht '5\' 2"'  (1.575 m)   Wt 181 lb 9.6 oz (82.4 kg)   LMP 08/13/2001   BMI 33.22 kg/m      Assessment & Plan:  LILLYANA MAJETTE comes in today with chief complaint of Medical Management of Chronic Issues (six month  recheck)   Diagnosis and orders addressed:  1. Essential hypertension  - CMP14+EGFR - CBC with Differential/Platelet - Compression stockings - amLODipine (NORVASC) 10 MG tablet; Take 1 tablet (10 mg total) by mouth daily.  Dispense: 90 tablet; Refill: 3 - lisinopril (PRINIVIL,ZESTRIL) 20 MG tablet; Take 1 tablet (20 mg total) by mouth 2 (two) times daily.  Dispense: 180 tablet; Refill: 3 - metoprolol succinate (TOPROL-XL) 100 MG 24 hr tablet; TAKE 1 TABLET BY MOUTH ONCE DAILY WITH OR IMMEDIATELY FOLLOWING A MEAL  Dispense: 90 tablet; Refill: 3  2. Gastroesophageal reflux disease, esophagitis presence not specified - CMP14+EGFR - CBC with Differential/Platelet - omeprazole (PRILOSEC) 20 MG capsule; Take 1 capsule (20 mg total) by mouth daily.  Dispense: 90 capsule; Refill: 3  3. Hyperlipidemia, unspecified hyperlipidemia type - CMP14+EGFR - CBC with Differential/Platelet - atorvastatin (LIPITOR) 80 MG tablet; Take 1 tablet (80 mg total) by mouth daily.  Dispense: 90 tablet; Refill: 3  4. Depression, major, single episode, mild (HCC) - CMP14+EGFR - CBC with Differential/Platelet  5. Obesity (BMI 30-39.9) - CMP14+EGFR - CBC with Differential/Platelet  6. Peripheral edema - Compression stockings   Labs pending Health Maintenance reviewed Diet and exercise encouraged  Follow up plan: 6 months    Evelina Dun, FNP

## 2018-05-14 NOTE — Patient Instructions (Signed)

## 2018-05-17 ENCOUNTER — Other Ambulatory Visit: Payer: Self-pay | Admitting: *Deleted

## 2018-05-17 DIAGNOSIS — R748 Abnormal levels of other serum enzymes: Secondary | ICD-10-CM

## 2018-05-18 LAB — SPECIMEN STATUS REPORT

## 2018-05-18 LAB — GAMMA GT: GGT: 14 IU/L (ref 0–60)

## 2018-05-21 ENCOUNTER — Other Ambulatory Visit: Payer: Self-pay | Admitting: Family

## 2018-05-21 DIAGNOSIS — Z780 Asymptomatic menopausal state: Secondary | ICD-10-CM

## 2018-06-07 ENCOUNTER — Other Ambulatory Visit: Payer: BLUE CROSS/BLUE SHIELD

## 2018-11-12 ENCOUNTER — Ambulatory Visit (INDEPENDENT_AMBULATORY_CARE_PROVIDER_SITE_OTHER): Payer: BLUE CROSS/BLUE SHIELD | Admitting: Family

## 2018-11-12 ENCOUNTER — Encounter: Payer: Self-pay | Admitting: Family

## 2018-11-12 DIAGNOSIS — F320 Major depressive disorder, single episode, mild: Secondary | ICD-10-CM | POA: Diagnosis not present

## 2018-11-12 DIAGNOSIS — I10 Essential (primary) hypertension: Secondary | ICD-10-CM | POA: Diagnosis not present

## 2018-11-12 DIAGNOSIS — E669 Obesity, unspecified: Secondary | ICD-10-CM

## 2018-11-12 DIAGNOSIS — K219 Gastro-esophageal reflux disease without esophagitis: Secondary | ICD-10-CM

## 2018-11-12 DIAGNOSIS — E785 Hyperlipidemia, unspecified: Secondary | ICD-10-CM | POA: Diagnosis not present

## 2018-11-12 NOTE — Progress Notes (Signed)
Virtual Visit via telephone Note Due to COVID-19 pandemic this visit was conducted virtually. This visit type was conducted due to national recommendations for restrictions regarding the COVID-19 Pandemic (e.g. social distancing, sheltering in place) in an effort to limit this patient's exposure and mitigate transmission in our community. All issues noted in this document were discussed and addressed.  A physical exam was not performed with this format.  I connected with Gina Murray on 11/12/18 at 8:15 AM by telephone and verified that I am speaking with the correct person using two identifiers. Gina Murray is currently located at home  and no one is currently with her during visit. The provider, Evelina Dun, FNP is located in their office at time of visit.  I discussed the limitations, risks, security and privacy concerns of performing an evaluation and management service by telephone and the availability of in person appointments. I also discussed with the patient that there may be a patient responsible charge related to this service. The patient expressed understanding and agreed to proceed.   History and Present Illness:  Pt presents to the office today for chronic follow up.  Hypertension This is a chronic problem. The current episode started more than 1 year ago. The problem has been resolved (123/54) since onset. The problem is controlled. Associated symptoms include peripheral edema ("at the end of the day"). Pertinent negatives include no blurred vision, chest pain, malaise/fatigue or shortness of breath. Risk factors for coronary artery disease include dyslipidemia, obesity and sedentary lifestyle. The current treatment provides moderate improvement. There is no history of kidney disease, CAD/MI, CVA or heart failure.  Gastroesophageal Reflux She complains of belching and heartburn. She reports no chest pain. This is a chronic problem. The current episode started more than 1 year  ago. The problem occurs occasionally. The problem has been waxing and waning. She has tried a PPI for the symptoms. The treatment provided moderate relief.  Hyperlipidemia This is a chronic problem. The current episode started more than 1 year ago. The problem is uncontrolled. Recent lipid tests were reviewed and are normal. Exacerbating diseases include obesity. Pertinent negatives include no chest pain or shortness of breath. Current antihyperlipidemic treatment includes statins. The current treatment provides moderate improvement of lipids. Risk factors for coronary artery disease include dyslipidemia, hypertension, post-menopausal and a sedentary lifestyle.  Depression        This is a chronic problem.  The current episode started more than 1 year ago.   The onset quality is gradual.   The problem occurs intermittently.  The problem has been waxing and waning since onset.  Associated symptoms include irritable, restlessness, decreased interest and sad.  Associated symptoms include no helplessness and no hopelessness.  Past treatments include nothing.  Compliance with treatment is good.     Review of Systems  Constitutional: Negative for malaise/fatigue.  Eyes: Negative for blurred vision.  Respiratory: Negative for shortness of breath.   Cardiovascular: Negative for chest pain.  Gastrointestinal: Positive for heartburn.  Psychiatric/Behavioral: Positive for depression.  All other systems reviewed and are negative.    Observations/Objective: No SOB or distress noted  Assessment and Plan: Gina Murray comes in today with chief complaint of No chief complaint on file.   Diagnosis and orders addressed:  1. Essential hypertension - CMP14+EGFR - CBC with Differential/Platelet  2. Gastroesophageal reflux disease, esophagitis presence not specified - CMP14+EGFR - CBC with Differential/Platelet  3. Depression, major, single episode, mild (HCC) - CMP14+EGFR - CBC  with  Differential/Platelet  4. Hyperlipidemia, unspecified hyperlipidemia type - CMP14+EGFR - CBC with Differential/Platelet  5. Obesity (BMI 30-39.9) - CMP14+EGFR - CBC with Differential/Platelet   Labs pending Health Maintenance reviewed Diet and exercise encouraged  Follow up plan: 6 months        I discussed the assessment and treatment plan with the patient. The patient was provided an opportunity to ask questions and all were answered. The patient agreed with the plan and demonstrated an understanding of the instructions.   The patient was advised to call back or seek an in-person evaluation if the symptoms worsen or if the condition fails to improve as anticipated.  The above assessment and management plan was discussed with the patient. The patient verbalized understanding of and has agreed to the management plan. Patient is aware to call the clinic if symptoms persist or worsen. Patient is aware when to return to the clinic for a follow-up visit. Patient educated on when it is appropriate to go to the emergency department.   Time call ended:  8:32 AM  I provided 17 minutes of non-face-to-face time during this encounter.    Evelina Dun, FNP

## 2018-11-18 ENCOUNTER — Other Ambulatory Visit: Payer: BLUE CROSS/BLUE SHIELD

## 2018-11-18 ENCOUNTER — Other Ambulatory Visit: Payer: Self-pay

## 2018-11-18 DIAGNOSIS — I10 Essential (primary) hypertension: Secondary | ICD-10-CM | POA: Diagnosis not present

## 2018-11-18 DIAGNOSIS — K219 Gastro-esophageal reflux disease without esophagitis: Secondary | ICD-10-CM | POA: Diagnosis not present

## 2018-11-18 DIAGNOSIS — E785 Hyperlipidemia, unspecified: Secondary | ICD-10-CM | POA: Diagnosis not present

## 2018-11-18 DIAGNOSIS — F320 Major depressive disorder, single episode, mild: Secondary | ICD-10-CM | POA: Diagnosis not present

## 2018-11-18 LAB — CBC WITH DIFFERENTIAL/PLATELET
Basophils Absolute: 0.1 x10E3/uL (ref 0.0–0.2)
Basos: 1 %
EOS (ABSOLUTE): 0.2 x10E3/uL (ref 0.0–0.4)
Eos: 3 %
Hematocrit: 34.9 % (ref 34.0–46.6)
Hemoglobin: 11.5 g/dL (ref 11.1–15.9)
Immature Grans (Abs): 0 x10E3/uL (ref 0.0–0.1)
Immature Granulocytes: 0 %
Lymphocytes Absolute: 2 x10E3/uL (ref 0.7–3.1)
Lymphs: 34 %
MCH: 28 pg (ref 26.6–33.0)
MCHC: 33 g/dL (ref 31.5–35.7)
MCV: 85 fL (ref 79–97)
Monocytes Absolute: 0.6 x10E3/uL (ref 0.1–0.9)
Monocytes: 10 %
Neutrophils Absolute: 3 x10E3/uL (ref 1.4–7.0)
Neutrophils: 52 %
Platelets: 292 x10E3/uL (ref 150–450)
RBC: 4.1 x10E6/uL (ref 3.77–5.28)
RDW: 14.3 % (ref 11.7–15.4)
WBC: 5.8 x10E3/uL (ref 3.4–10.8)

## 2018-11-18 LAB — CMP14+EGFR
ALT: 24 IU/L (ref 0–32)
AST: 29 IU/L (ref 0–40)
Albumin/Globulin Ratio: 2 (ref 1.2–2.2)
Albumin: 4.4 g/dL (ref 3.8–4.8)
Alkaline Phosphatase: 120 IU/L — ABNORMAL HIGH (ref 39–117)
BUN/Creatinine Ratio: 20 (ref 12–28)
BUN: 21 mg/dL (ref 8–27)
Bilirubin Total: 0.5 mg/dL (ref 0.0–1.2)
CO2: 25 mmol/L (ref 20–29)
Calcium: 9.4 mg/dL (ref 8.7–10.3)
Chloride: 102 mmol/L (ref 96–106)
Creatinine, Ser: 1.06 mg/dL — ABNORMAL HIGH (ref 0.57–1.00)
GFR calc Af Amer: 65 mL/min/{1.73_m2} (ref >59)
GFR calc non Af Amer: 56 mL/min/{1.73_m2} — ABNORMAL LOW (ref >59)
Globulin, Total: 2.2 g/dL (ref 1.5–4.5)
Glucose: 97 mg/dL (ref 65–99)
Potassium: 3.9 mmol/L (ref 3.5–5.2)
Sodium: 142 mmol/L (ref 134–144)
Total Protein: 6.6 g/dL (ref 6.0–8.5)

## 2018-12-30 ENCOUNTER — Other Ambulatory Visit: Payer: Self-pay

## 2018-12-31 ENCOUNTER — Ambulatory Visit (INDEPENDENT_AMBULATORY_CARE_PROVIDER_SITE_OTHER): Payer: BLUE CROSS/BLUE SHIELD | Admitting: *Deleted

## 2018-12-31 DIAGNOSIS — Z23 Encounter for immunization: Secondary | ICD-10-CM | POA: Diagnosis not present

## 2019-05-05 DIAGNOSIS — Z23 Encounter for immunization: Secondary | ICD-10-CM | POA: Diagnosis not present

## 2019-05-21 ENCOUNTER — Other Ambulatory Visit: Payer: Self-pay | Admitting: Family

## 2019-05-21 DIAGNOSIS — I10 Essential (primary) hypertension: Secondary | ICD-10-CM

## 2019-06-03 DIAGNOSIS — Z23 Encounter for immunization: Secondary | ICD-10-CM | POA: Diagnosis not present

## 2019-06-13 ENCOUNTER — Telehealth (INDEPENDENT_AMBULATORY_CARE_PROVIDER_SITE_OTHER): Payer: BLUE CROSS/BLUE SHIELD | Admitting: Family Medicine

## 2019-06-13 ENCOUNTER — Encounter: Payer: Self-pay | Admitting: Family Medicine

## 2019-06-13 DIAGNOSIS — G44319 Acute post-traumatic headache, not intractable: Secondary | ICD-10-CM | POA: Diagnosis not present

## 2019-06-13 MED ORDER — BUTALBITAL-APAP-CAFFEINE 50-300-40 MG PO CAPS: 1.0000 | ORAL_CAPSULE | ORAL | 1 refills | Status: DC | PRN

## 2019-06-13 NOTE — Progress Notes (Signed)
Subjective:    Patient ID: Gina Murray, female    DOB: Mar 24, 1956, 64 y.o.   MRN: [id number]   HPI: Gina Murray is a 64 y.o. female presenting for headache onset after having second CoVID shot. Shot given on 3/12. Fatigue started on 3/14. Fell over onto the kitchen floor. May have hit her head.  Back of head is sore. A couple fo days later developed HA that last several hours.. BP is good although she missed two doses of her BP med. Hurts if she stands from seated. Taking tylenol ne a time until yesterday. Helped with the HA. Strong, tension pain. Didn't help this morning. Denies numbness or weakness of an extremity. No more forgetful than baseline. Not photosensitive. They come and go.    Depression screen [facility] 2/9 11/12/2018 05/14/2018 12/11/2017 10/23/2017 05/19/2017  Decreased Interest 1 0 3 0 0  Down, Depressed, Hopeless 0 0 0 0 0  PHQ - 2 Score 1 0 3 0 0  Altered sleeping 0 - 0 - -  Tired, decreased energy 0 - 1 - -  Change in appetite 0 - 3 - -  Feeling bad or failure about yourself  0 - 3 - -  Trouble concentrating 0 - 2 - -  Moving slowly or fidgety/restless 0 - 0 - -  Suicidal thoughts 0 - 0 - -  PHQ-9 Score 1 - 12 - -  Difficult doing work/chores Not difficult at all - - - -     Relevant past medical, surgical, family and social history reviewed and updated as indicated.  Interim medical history since our last visit reviewed. Allergies and medications reviewed and updated.  ROS:  Review of Systems  Constitutional: Positive for fatigue.  HENT: Negative.   Eyes: Negative for visual disturbance.  Respiratory: Negative for shortness of breath.   Cardiovascular: Negative for chest pain.  Gastrointestinal: Negative for abdominal pain.  Musculoskeletal: Negative for arthralgias.  Neurological: Positive for headaches. Negative for weakness.     Social History   Tobacco Use  Smoking Status Never Smoker  Smokeless Tobacco Never Used       Objective:     Wt  Readings from Last 3 Encounters:  05/14/18 181 lb 9.6 oz (82.4 kg)  12/11/17 174 lb 12.8 oz (79.3 kg)  11/13/17 172 lb 4 oz (78.1 kg)     Exam deferred. Pt. Harboring due to COVID 19. Phone visit performed.   Assessment & Plan:   1. Acute post-traumatic headache, not intractable     Meds ordered this encounter  Medications  . Butalbital-APAP-Caffeine (FIORICET) 50-300-40 MG CAPS    Sig: Take 1 capsule by mouth every 4 (four) hours as needed (headache).    Dispense:  20 capsule    Refill:  1    No orders of the defined types were placed in this encounter.     Diagnoses and all orders for this visit:  Acute post-traumatic headache, not intractable -     Butalbital-APAP-Caffeine (FIORICET) 50-300-40 MG CAPS; Take 1 capsule by mouth every 4 (four) hours as needed (headache).  Rest, avoid activities that cause HA. Notify the office if focal numbness or weakness occur. Also N&V or vision changes. Use fioricet as a backup to tylenol when it doesn't relieve the pain.  Virtual Visit via telephone Note  I discussed the limitations, risks, security and privacy concerns of performing an evaluation and management service by telephone and the availability of in person appointments. The patient  was identified with two identifiers. Pt.expressed understanding and agreed to proceed. Pt. Is at home. Dr. Livia Snellen is in his office.  Follow Up Instructions:   I discussed the assessment and treatment plan with the patient. The patient was provided an opportunity to ask questions and all were answered. The patient agreed with the plan and demonstrated an understanding of the instructions.   The patient was advised to call back or seek an in-person evaluation if the symptoms worsen or if the condition fails to improve as anticipated.   Total minutes including chart review and phone contact time: 21   Follow up plan: Return if symptoms worsen or fail to improve.  Claretta Fraise, MD [place]

## 2019-06-23 ENCOUNTER — Other Ambulatory Visit: Payer: Self-pay | Admitting: Family

## 2019-06-23 DIAGNOSIS — E785 Hyperlipidemia, unspecified: Secondary | ICD-10-CM

## 2019-06-23 DIAGNOSIS — K219 Gastro-esophageal reflux disease without esophagitis: Secondary | ICD-10-CM

## 2019-06-23 DIAGNOSIS — I10 Essential (primary) hypertension: Secondary | ICD-10-CM

## 2019-06-23 NOTE — Telephone Encounter (Signed)
Needs appointment for further Refills

## 2019-08-02 DIAGNOSIS — Z01419 Encounter for gynecological examination (general) (routine) without abnormal findings: Secondary | ICD-10-CM | POA: Diagnosis not present

## 2019-08-02 DIAGNOSIS — Z1231 Encounter for screening mammogram for malignant neoplasm of breast: Secondary | ICD-10-CM | POA: Diagnosis not present

## 2019-08-02 DIAGNOSIS — Z872 Personal history of diseases of the skin and subcutaneous tissue: Secondary | ICD-10-CM | POA: Insufficient documentation

## 2019-08-02 DIAGNOSIS — Z6832 Body mass index (BMI) 32.0-32.9, adult: Secondary | ICD-10-CM | POA: Diagnosis not present

## 2019-08-05 DIAGNOSIS — Z13820 Encounter for screening for osteoporosis: Secondary | ICD-10-CM | POA: Diagnosis not present

## 2019-08-07 ENCOUNTER — Other Ambulatory Visit: Payer: Self-pay | Admitting: Family

## 2019-08-07 DIAGNOSIS — I10 Essential (primary) hypertension: Secondary | ICD-10-CM

## 2019-08-09 ENCOUNTER — Other Ambulatory Visit: Payer: Self-pay | Admitting: Family

## 2019-08-09 DIAGNOSIS — I10 Essential (primary) hypertension: Secondary | ICD-10-CM

## 2019-08-14 ENCOUNTER — Other Ambulatory Visit: Payer: Self-pay | Admitting: Family

## 2019-08-14 DIAGNOSIS — I10 Essential (primary) hypertension: Secondary | ICD-10-CM

## 2019-08-26 DIAGNOSIS — M8580 Other specified disorders of bone density and structure, unspecified site: Secondary | ICD-10-CM | POA: Diagnosis not present

## 2019-08-26 DIAGNOSIS — M818 Other osteoporosis without current pathological fracture: Secondary | ICD-10-CM | POA: Diagnosis not present

## 2019-08-29 NOTE — Progress Notes (Signed)
Juliann Pulse ma

## 2019-09-06 ENCOUNTER — Telehealth: Payer: Self-pay | Admitting: Family

## 2019-09-06 ENCOUNTER — Other Ambulatory Visit: Payer: Self-pay

## 2019-09-06 ENCOUNTER — Encounter: Payer: Self-pay | Admitting: Family

## 2019-09-06 ENCOUNTER — Ambulatory Visit: Payer: BLUE CROSS/BLUE SHIELD | Admitting: Family

## 2019-09-06 VITALS — BP 150/74 | HR 77 | Temp 98.0°F | Ht 62.0 in | Wt 173.4 lb

## 2019-09-06 DIAGNOSIS — E669 Obesity, unspecified: Secondary | ICD-10-CM | POA: Diagnosis not present

## 2019-09-06 DIAGNOSIS — I10 Essential (primary) hypertension: Secondary | ICD-10-CM

## 2019-09-06 DIAGNOSIS — K219 Gastro-esophageal reflux disease without esophagitis: Secondary | ICD-10-CM | POA: Diagnosis not present

## 2019-09-06 DIAGNOSIS — E785 Hyperlipidemia, unspecified: Secondary | ICD-10-CM | POA: Diagnosis not present

## 2019-09-06 DIAGNOSIS — M85859 Other specified disorders of bone density and structure, unspecified thigh: Secondary | ICD-10-CM

## 2019-09-06 DIAGNOSIS — F320 Major depressive disorder, single episode, mild: Secondary | ICD-10-CM

## 2019-09-06 MED ORDER — AMLODIPINE BESYLATE 10 MG PO TABS: 10.0000 mg | ORAL_TABLET | Freq: Every day | ORAL | 0 refills | Status: DC

## 2019-09-06 MED ORDER — METOPROLOL SUCCINATE ER 100 MG PO TB24: ORAL_TABLET | ORAL | 0 refills | Status: DC

## 2019-09-06 MED ORDER — LISINOPRIL 20 MG PO TABS: 20.0000 mg | ORAL_TABLET | Freq: Two times a day (BID) | ORAL | 0 refills | Status: DC

## 2019-09-06 MED ORDER — ATORVASTATIN CALCIUM 80 MG PO TABS: 80.0000 mg | ORAL_TABLET | Freq: Every day | ORAL | 0 refills | Status: DC

## 2019-09-06 MED ORDER — FAMOTIDINE 20 MG PO TABS
20.0000 mg | ORAL_TABLET | Freq: Two times a day (BID) | ORAL | 2 refills | Status: DC
Start: 2019-09-06 — End: 2020-03-08

## 2019-09-06 NOTE — Progress Notes (Signed)
Subjective:    Patient ID: Gina Murray, female    DOB: [birth year], 64 y.o.   MRN: [id number]   Chief Complaint  Patient presents with  . Hypertension  . Gastroesophageal Reflux    insurance will notpay for omeprazole any more   Pt presents to the office today for chronic follow up. She is followed by Gyn annually .Had her Dexa scan 08/05/19 that showed Osteopenia. She is taking Calcium and Vit D.  Hypertension This is a chronic problem. The current episode started more than 1 year ago. The problem has been waxing and waning since onset. The problem is uncontrolled. Pertinent negatives include no malaise/fatigue, peripheral edema or shortness of breath. Risk factors for coronary artery disease include dyslipidemia and obesity. The current treatment provides moderate improvement. There is no history of kidney disease or heart failure.  Gastroesophageal Reflux She complains of belching and heartburn. This is a chronic problem. The current episode started more than 1 year ago. The problem occurs occasionally. Risk factors include obesity. She has tried a PPI for the symptoms. The treatment provided moderate relief.  Hyperlipidemia This is a chronic problem. The current episode started more than 1 year ago. The problem is controlled. Recent lipid tests were reviewed and are normal. Exacerbating diseases include obesity. Pertinent negatives include no shortness of breath. Current antihyperlipidemic treatment includes statins. The current treatment provides moderate improvement of lipids. Risk factors for coronary artery disease include dyslipidemia, hypertension and a sedentary lifestyle.  Depression        This is a chronic problem.  The current episode started more than 1 year ago.   The onset quality is gradual.   The problem occurs intermittently.  Associated symptoms include helplessness, restlessness and decreased interest.  Associated symptoms include no hopelessness.     The symptoms are  aggravated by family issues.     Review of Systems  Constitutional: Negative for malaise/fatigue.  Respiratory: Negative for shortness of breath.   Gastrointestinal: Positive for heartburn.  Psychiatric/Behavioral: Positive for depression.  All other systems reviewed and are negative.      Objective:   Physical Exam Vitals reviewed.  Constitutional:      General: She is not in acute distress.    Appearance: She is well-developed.  HENT:     Head: Normocephalic and atraumatic.     Right Ear: Tympanic membrane normal.     Left Ear: Tympanic membrane normal.  Eyes:     Pupils: Pupils are equal, round, and reactive to light.  Neck:     Thyroid: No thyromegaly.  Cardiovascular:     Rate and Rhythm: Normal rate and regular rhythm.     Heart sounds: Normal heart sounds. No murmur heard.   Pulmonary:     Effort: Pulmonary effort is normal. No respiratory distress.     Breath sounds: Normal breath sounds. No wheezing.  Abdominal:     General: Bowel sounds are normal. There is no distension.     Palpations: Abdomen is soft.     Tenderness: There is no abdominal tenderness.  Musculoskeletal:        General: No tenderness. Normal range of motion.     Cervical back: Normal range of motion and neck supple.  Skin:    General: Skin is warm and dry.  Neurological:     Mental Status: She is alert and oriented to person, place, and time.     Cranial Nerves: No cranial nerve deficit.     Deep  Tendon Reflexes: Reflexes are normal and symmetric.  Psychiatric:        Behavior: Behavior normal.        Thought Content: Thought content normal.        Judgment: Judgment normal.       BP (!) 150/74   Pulse 77   Temp 98 F (36.7 C) (Temporal)   Ht '5\' 2"'  (1.575 m)   Wt 173 lb 6.4 oz (78.7 kg)   LMP 08/13/2001   SpO2 100%   BMI 31.72 kg/m      Assessment & Plan:  Gina Murray comes in today with chief complaint of Hypertension and Gastroesophageal Reflux (insurance will  notpay for omeprazole any more)   Diagnosis and orders addressed:  1. Essential hypertension -Pt will monitor at home, states it is stable at home - metoprolol succinate (TOPROL-XL) 100 MG 24 hr tablet; Take with or immediately following a meal.  Dispense: 90 tablet; Refill: 0 - lisinopril (ZESTRIL) 20 MG tablet; Take 1 tablet (20 mg total) by mouth 2 (two) times daily. (Needs to be seen before next refill)  Dispense: 60 tablet; Refill: 0 - amLODipine (NORVASC) 10 MG tablet; Take 1 tablet (10 mg total) by mouth daily. (Needs to be seen before next refill)  Dispense: 30 tablet; Refill: 0 - CMP14+EGFR - CBC with Differential/Platelet  2. Hyperlipidemia, unspecified hyperlipidemia type - atorvastatin (LIPITOR) 80 MG tablet; Take 1 tablet (80 mg total) by mouth daily.  Dispense: 90 tablet; Refill: 0 - CMP14+EGFR - CBC with Differential/Platelet - Lipid panel  3. Gastroesophageal reflux disease, unspecified whether esophagitis present - CMP14+EGFR - CBC with Differential/Platelet  4. Depression, major, single episode, mild (HCC) - CMP14+EGFR - CBC with Differential/Platelet  5. Obesity (BMI 30-39.9 - CMP14+EGFR - CBC with Differential/Platelet  6. Osteopenia of neck of femur, unspecified laterality - CMP14+EGFR - CBC with Differential/Platelet   Labs pending Health Maintenance reviewed Diet and exercise encouraged  Follow up plan: 6 months   Evelina Dun, FNP

## 2019-09-06 NOTE — Patient Instructions (Signed)
Osteopenia  Osteopenia is a loss of thickness (density) inside of the bones. Another name for osteopenia is low bone mass. Mild osteopenia is a normal part of aging. It is not a disease, and it does not cause symptoms. However, if you have osteopenia and continue to lose bone mass, you could develop a condition that causes the bones to become thin and break more easily (osteoporosis). You may also lose some height, have back pain, and have a stooped posture. Although osteopenia is not a disease, making changes to your lifestyle and diet can help to prevent osteopenia from developing into osteoporosis. What are the causes? Osteopenia is caused by loss of calcium in the bones.  Bones are constantly changing. Old bone cells are continually being replaced with new bone cells. This process builds new bone. The mineral calcium is needed to build new bone and maintain bone density. Bone density is usually highest around age 35. After that, most people's bodies cannot replace all the bone they have lost with new bone. What increases the risk? You are more likely to develop this condition if:  You are older than age 50.  You are a woman who went through menopause early.  You have a long illness that keeps you in bed.  You do not get enough exercise.  You lack certain nutrients (malnutrition).  You have an overactive thyroid gland (hyperthyroidism).  You smoke.  You drink a lot of alcohol.  You are taking medicines that weaken the bones, such as steroids. What are the signs or symptoms? This condition does not cause any symptoms. You may have a slightly higher risk for bone breaks (fractures), so getting fractures more easily than normal may be an indication of osteopenia. How is this diagnosed? Your health care provider can diagnose this condition with a special type of X-ray exam that measures bone density (dual-energy X-ray absorptiometry, DEXA). This test can measure bone density in your  hips, spine, and wrists. Osteopenia has no symptoms, so this condition is usually diagnosed after a routine bone density screening test is done for osteoporosis. This routine screening is usually done for:  Women who are age 65 or older.  Men who are age 70 or older. If you have risk factors for osteopenia, you may have the screening test at an earlier age. How is this treated? Making dietary and lifestyle changes can lower your risk for osteoporosis. If you have severe osteopenia that is close to becoming osteoporosis, your health care provider may prescribe medicines and dietary supplements such as calcium and vitamin D. These supplements help to rebuild bone density. Follow these instructions at home:   Take over-the-counter and prescription medicines only as told by your health care provider. These include vitamins and supplements.  Eat a diet that is high in calcium and vitamin D. ? Calcium is found in dairy products, beans, salmon, and leafy green vegetables like spinach and broccoli. ? Look for foods that have vitamin D and calcium added to them (fortified foods), such as orange juice, cereal, and bread.  Do 30 or more minutes of a weight-bearing exercise every day, such as walking, jogging, or playing a sport. These types of exercises strengthen the bones.  Take precautions at home to lower your risk of falling, such as: ? Keeping rooms well-lit and free of clutter, such as cords. ? Installing safety rails on stairs. ? Using rubber mats in the bathroom or other areas that are often wet or slippery.  Do not use   any products that contain nicotine or tobacco, such as cigarettes and e-cigarettes. If you need help quitting, ask your health care provider.  Avoid alcohol or limit alcohol intake to no more than 1 drink a day for nonpregnant women and 2 drinks a day for men. One drink equals 12 oz of beer, 5 oz of wine, or 1 oz of hard liquor.  Keep all follow-up visits as told by your  health care provider. This is important. Contact a health care provider if:  You have not had a bone density screening for osteoporosis and you are: ? A woman, age 65 or older. ? A man, age 70 or older.  You are a postmenopausal woman who has not had a bone density screening for osteoporosis.  You are older than age 50 and you want to know if you should have bone density screening for osteoporosis. Summary  Osteopenia is a loss of thickness (density) inside of the bones. Another name for osteopenia is low bone mass.  Osteopenia is not a disease, but it may increase your risk for a condition that causes the bones to become thin and break more easily (osteoporosis).  You may be at risk for osteopenia if you are older than age 50 or if you are a woman who went through early menopause.  Osteopenia does not cause any symptoms, but it can be diagnosed with a bone density screening test.  Dietary and lifestyle changes are the first treatment for osteopenia. These may lower your risk for osteoporosis. This information is not intended to replace advice given to you by your health care provider. Make sure you discuss any questions you have with your health care provider. Document Revised: 02/20/2017 Document Reviewed: 12/17/2016 Elsevier Patient Education  2020 [employer]  

## 2019-09-06 NOTE — Telephone Encounter (Signed)
Pt is wanting to know if Gina Murray wanted to change the omeprazole (PRILOSEC) 20 MG capsule To another medication, as her insurance will not pay for prilosec anymore or if she should take OTC medication.

## 2019-09-06 NOTE — Telephone Encounter (Signed)
Stop Omeprazole and start Pepcid 20 mg BID.

## 2019-09-06 NOTE — Addendum Note (Signed)
Addended by: Evelina Dun A on: 09/06/2019 04:45 PM   Modules accepted: Orders

## 2019-09-07 ENCOUNTER — Other Ambulatory Visit: Payer: Self-pay | Admitting: Family Medicine

## 2019-09-07 DIAGNOSIS — I10 Essential (primary) hypertension: Secondary | ICD-10-CM

## 2019-09-07 LAB — CMP14+EGFR
ALT: 20 IU/L (ref 0–32)
AST: 26 IU/L (ref 0–40)
Albumin/Globulin Ratio: 1.6 (ref 1.2–2.2)
Albumin: 4.4 g/dL (ref 3.8–4.8)
Alkaline Phosphatase: 146 IU/L — ABNORMAL HIGH (ref 48–121)
BUN/Creatinine Ratio: 19 (ref 12–28)
BUN: 17 mg/dL (ref 8–27)
Bilirubin Total: 0.7 mg/dL (ref 0.0–1.2)
CO2: 24 mmol/L (ref 20–29)
Calcium: 10 mg/dL (ref 8.7–10.3)
Chloride: 102 mmol/L (ref 96–106)
Creatinine, Ser: 0.89 mg/dL (ref 0.57–1.00)
GFR calc Af Amer: 79 mL/min/{1.73_m2} (ref >59)
GFR calc non Af Amer: 69 mL/min/{1.73_m2} (ref >59)
Globulin, Total: 2.7 g/dL (ref 1.5–4.5)
Glucose: 90 mg/dL (ref 65–99)
Potassium: 4.2 mmol/L (ref 3.5–5.2)
Sodium: 140 mmol/L (ref 134–144)
Total Protein: 7.1 g/dL (ref 6.0–8.5)

## 2019-09-07 LAB — CBC WITH DIFFERENTIAL/PLATELET
Basophils Absolute: 0.1 10*3/uL (ref 0.0–0.2)
Basos: 1 %
EOS (ABSOLUTE): 0.3 10*3/uL (ref 0.0–0.4)
Eos: 5 %
Hematocrit: 40.6 % (ref 34.0–46.6)
Hemoglobin: 12.9 g/dL (ref 11.1–15.9)
Immature Grans (Abs): 0 10*3/uL (ref 0.0–0.1)
Immature Granulocytes: 0 %
Lymphocytes Absolute: 2.6 10*3/uL (ref 0.7–3.1)
Lymphs: 38 %
MCH: 27.8 pg (ref 26.6–33.0)
MCHC: 31.8 g/dL (ref 31.5–35.7)
MCV: 88 fL (ref 79–97)
Monocytes Absolute: 0.6 10*3/uL (ref 0.1–0.9)
Monocytes: 9 %
Neutrophils Absolute: 3.2 10*3/uL (ref 1.4–7.0)
Neutrophils: 47 %
Platelets: 308 10*3/uL (ref 150–450)
RBC: 4.64 x10E6/uL (ref 3.77–5.28)
RDW: 14.5 % (ref 11.7–15.4)
WBC: 6.8 10*3/uL (ref 3.4–10.8)

## 2019-09-07 LAB — LIPID PANEL
Chol/HDL Ratio: 2.2 ratio (ref 0.0–4.4)
Cholesterol, Total: 181 mg/dL (ref 100–199)
HDL: 81 mg/dL (ref >39)
LDL Chol Calc (NIH): 86 mg/dL (ref 0–99)
Triglycerides: 76 mg/dL (ref 0–149)
VLDL Cholesterol Cal: 14 mg/dL (ref 5–40)

## 2019-09-07 MED ORDER — LISINOPRIL 20 MG PO TABS: 20.0000 mg | ORAL_TABLET | Freq: Two times a day (BID) | ORAL | 2 refills | Status: DC

## 2019-09-07 NOTE — Telephone Encounter (Signed)
Insurance won't pay for Pepcid either.  She will try to find out which medicine they will cover and call us back.

## 2019-09-08 ENCOUNTER — Telehealth: Payer: Self-pay | Admitting: Family

## 2019-09-08 ENCOUNTER — Other Ambulatory Visit: Payer: Self-pay | Admitting: Family

## 2019-09-08 ENCOUNTER — Encounter: Payer: Self-pay | Admitting: Family Medicine

## 2019-09-08 MED ORDER — FAMOTIDINE 40 MG/5ML PO SUSR: 20.0000 mg | Freq: Every day | ORAL | 6 refills | Status: DC

## 2019-09-08 NOTE — Telephone Encounter (Signed)
Not in cover my meds waiting for fax

## 2019-09-08 NOTE — Telephone Encounter (Signed)
Insurance will pay for the suspension of the Pepcid. Patient would like it sent in today.

## 2019-09-08 NOTE — Telephone Encounter (Signed)
Prescription sent to pharmacy.

## 2019-09-09 ENCOUNTER — Other Ambulatory Visit: Payer: Self-pay | Admitting: Family

## 2019-09-09 DIAGNOSIS — I10 Essential (primary) hypertension: Secondary | ICD-10-CM

## 2019-10-03 ENCOUNTER — Telehealth: Payer: Self-pay | Admitting: Family

## 2019-10-03 MED ORDER — BETAMETHASONE DIPROPIONATE 0.05 % EX CREA: TOPICAL_CREAM | Freq: Two times a day (BID) | CUTANEOUS | 2 refills | Status: DC

## 2019-10-03 NOTE — Telephone Encounter (Signed)
Prescription sent to pharmacy.

## 2019-12-25 ENCOUNTER — Other Ambulatory Visit: Payer: Self-pay | Admitting: Family

## 2019-12-25 DIAGNOSIS — I10 Essential (primary) hypertension: Secondary | ICD-10-CM

## 2019-12-25 DIAGNOSIS — E785 Hyperlipidemia, unspecified: Secondary | ICD-10-CM

## 2020-01-03 ENCOUNTER — Other Ambulatory Visit: Payer: Self-pay | Admitting: Family

## 2020-01-03 DIAGNOSIS — I10 Essential (primary) hypertension: Secondary | ICD-10-CM

## 2020-01-18 ENCOUNTER — Ambulatory Visit (INDEPENDENT_AMBULATORY_CARE_PROVIDER_SITE_OTHER): Payer: BLUE CROSS/BLUE SHIELD

## 2020-01-18 ENCOUNTER — Other Ambulatory Visit: Payer: Self-pay

## 2020-01-18 DIAGNOSIS — Z23 Encounter for immunization: Secondary | ICD-10-CM

## 2020-03-07 ENCOUNTER — Other Ambulatory Visit: Payer: Self-pay

## 2020-03-07 ENCOUNTER — Ambulatory Visit (INDEPENDENT_AMBULATORY_CARE_PROVIDER_SITE_OTHER): Payer: BLUE CROSS/BLUE SHIELD

## 2020-03-07 DIAGNOSIS — Z23 Encounter for immunization: Secondary | ICD-10-CM

## 2020-03-07 NOTE — Progress Notes (Signed)
   [facility] Vaccination [facility]  Name:  Gina Murray    MRN: [id number] DOB: September 14, 1955  03/07/2020  Gina Murray was observed post Covid-19 immunization for 15 minutes without incident. She was provided with Vaccine Information Sheet and instruction to access the V-Safe system.   Gina Murray was instructed to call 911 with any severe reactions post vaccine: Marland Kitchen Difficulty breathing  . Swelling of face and throat  . A fast heartbeat  . A bad rash all over body  . Dizziness and weakness   Immunizations Administered    No immunizations on file.

## 2020-03-08 ENCOUNTER — Ambulatory Visit: Payer: BLUE CROSS/BLUE SHIELD | Admitting: Family

## 2020-03-08 ENCOUNTER — Encounter: Payer: Self-pay | Admitting: Family

## 2020-03-08 VITALS — BP 135/64 | HR 80 | Temp 98.0°F | Ht 62.0 in | Wt 180.0 lb

## 2020-03-08 DIAGNOSIS — G5603 Carpal tunnel syndrome, bilateral upper limbs: Secondary | ICD-10-CM

## 2020-03-08 DIAGNOSIS — I10 Essential (primary) hypertension: Secondary | ICD-10-CM

## 2020-03-08 DIAGNOSIS — E785 Hyperlipidemia, unspecified: Secondary | ICD-10-CM

## 2020-03-08 DIAGNOSIS — F320 Major depressive disorder, single episode, mild: Secondary | ICD-10-CM | POA: Diagnosis not present

## 2020-03-08 DIAGNOSIS — E669 Obesity, unspecified: Secondary | ICD-10-CM

## 2020-03-08 DIAGNOSIS — K219 Gastro-esophageal reflux disease without esophagitis: Secondary | ICD-10-CM

## 2020-03-08 MED ORDER — METOPROLOL SUCCINATE ER 100 MG PO TB24: ORAL_TABLET | ORAL | 0 refills | Status: DC

## 2020-03-08 MED ORDER — DICLOFENAC SODIUM 75 MG PO TBEC: 75.0000 mg | DELAYED_RELEASE_TABLET | Freq: Two times a day (BID) | ORAL | 2 refills | Status: DC

## 2020-03-08 MED ORDER — OMEPRAZOLE 20 MG PO CPDR: 20.0000 mg | DELAYED_RELEASE_CAPSULE | Freq: Every day | ORAL | 3 refills | Status: DC

## 2020-03-08 MED ORDER — AMLODIPINE BESYLATE 10 MG PO TABS: 10.0000 mg | ORAL_TABLET | Freq: Every day | ORAL | 0 refills | Status: DC

## 2020-03-08 MED ORDER — LISINOPRIL 20 MG PO TABS: 20.0000 mg | ORAL_TABLET | Freq: Two times a day (BID) | ORAL | 2 refills | Status: DC

## 2020-03-08 MED ORDER — ATORVASTATIN CALCIUM 80 MG PO TABS: 80.0000 mg | ORAL_TABLET | Freq: Every day | ORAL | 1 refills | Status: DC

## 2020-03-08 NOTE — Patient Instructions (Signed)
Carpal Tunnel Syndrome  Carpal tunnel syndrome is a condition that causes pain in your hand and arm. The carpal tunnel is a narrow area located on the palm side of your wrist. Repeated wrist motion or certain diseases may cause swelling within the tunnel. This swelling pinches the main nerve in the wrist (median nerve). What are the causes? This condition may be caused by:  Repeated wrist motions.  Wrist injuries.  Arthritis.  A cyst or tumor in the carpal tunnel.  Fluid buildup during pregnancy. Sometimes the cause of this condition is not known. What increases the risk? The following factors may make you more likely to develop this condition:  Having a job, such as being a butcher or a cashier, that requires you to repeatedly move your wrist in the same motion.  Being a woman.  Having certain conditions, such as: ? Diabetes. ? Obesity. ? An underactive thyroid (hypothyroidism). ? Kidney failure. What are the signs or symptoms? Symptoms of this condition include:  A tingling feeling in your fingers, especially in your thumb, index, and middle fingers.  Tingling or numbness in your hand.  An aching feeling in your entire arm, especially when your wrist and elbow are bent for a long time.  Wrist pain that goes up your arm to your shoulder.  Pain that goes down into your palm or fingers.  A weak feeling in your hands. You may have trouble grabbing and holding items. Your symptoms may feel worse during the night. How is this diagnosed? This condition is diagnosed with a medical history and physical exam. You may also have tests, including:  Electromyogram (EMG). This test measures electrical signals sent by your nerves into the muscles.  Nerve conduction study. This test measures how well electrical signals pass through your nerves.  Imaging tests, such as X-rays, ultrasound, and MRI. These tests check for possible causes of your condition. How is this treated? This  condition may be treated with:  Lifestyle changes. It is important to stop or change the activity that caused your condition.  Doing exercise and activities to strengthen your muscles and bones (physical therapy).  Learning how to use your hand again after diagnosis (occupational therapy).  Medicines for pain and inflammation. This may include medicine that is injected into your wrist.  A wrist splint.  Surgery. Follow these instructions at home: If you have a splint:  Wear the splint as told by your health care provider. Remove it only as told by your health care provider.  Loosen the splint if your fingers tingle, become numb, or turn cold and blue.  Keep the splint clean.  If the splint is not waterproof: ? Do not let it get wet. ? Cover it with a watertight covering when you take a bath or shower. Managing pain, stiffness, and swelling   If directed, put ice on the painful area: ? If you have a removable splint, remove it as told by your health care provider. ? Put ice in a plastic bag. ? Place a towel between your skin and the bag. ? Leave the ice on for 20 minutes, 2-3 times per day. General instructions  Take over-the-counter and prescription medicines only as told by your health care provider.  Rest your wrist from any activity that may be causing your pain. If your condition is work related, talk with your employer about changes that can be made, such as getting a wrist pad to use while typing.  Do any exercises as told   by your health care provider, physical therapist, or occupational therapist.  Keep all follow-up visits as told by your health care provider. This is important. Contact a health care provider if:  You have new symptoms.  Your pain is not controlled with medicines.  Your symptoms get worse. Get help right away if:  You have severe numbness or tingling in your wrist or hand. Summary  Carpal tunnel syndrome is a condition that causes pain in  your hand and arm.  It is usually caused by repeated wrist motions.  Lifestyle changes and medicines are used to treat carpal tunnel syndrome. Surgery may be recommended.  Follow your health care provider's instructions about wearing a splint, resting from activity, keeping follow-up visits, and calling for help. This information is not intended to replace advice given to you by your health care provider. Make sure you discuss any questions you have with your health care provider. Document Revised: 07/17/2017 Document Reviewed: 07/17/2017 Elsevier Patient Education  2020 [employer]  

## 2020-03-08 NOTE — Progress Notes (Signed)
Subjective:    Patient ID: Gina Murray, female    DOB: Feb 06, 1956, 64 y.o.   MRN: [id number]  Chief Complaint  Patient presents with  . Medical Management of Chronic Issues  . Hypertension  . Numbness    In right thumb been going on for a couple a months.  . Gastroesophageal Reflux    Wants to go back on omeprazole    Pt presents to the office today for chronic follow up. She is followed by Gyn annually .Had her Dexa scan 08/05/19 that showed Osteopenia. She is taking Calcium and Vit D.   PT complaining of bilateral hand numbness and tingling that is worse at night and worse in her thumb.  Hypertension This is a chronic problem. The current episode started more than 1 year ago. The problem has been resolved since onset. The problem is controlled. Pertinent negatives include no malaise/fatigue, peripheral edema or shortness of breath. Risk factors for coronary artery disease include dyslipidemia and sedentary lifestyle. The current treatment provides moderate improvement.  Gastroesophageal Reflux She complains of belching and heartburn. This is a chronic problem. The current episode started more than 1 year ago. The problem occurs occasionally. The problem has been waxing and waning. She has tried a PPI for the symptoms. The treatment provided moderate relief.  Hyperlipidemia This is a chronic problem. The current episode started more than 1 year ago. Exacerbating diseases include obesity. Pertinent negatives include no shortness of breath. Current antihyperlipidemic treatment includes statins. The current treatment provides moderate improvement of lipids. Risk factors for coronary artery disease include dyslipidemia, hypertension, post-menopausal and a sedentary lifestyle.  Depression        This is a chronic problem.  The current episode started more than 1 year ago.   The onset quality is gradual.   The problem occurs intermittently.  Associated symptoms include irritable, restlessness  and sad.  Associated symptoms include no helplessness and no hopelessness.     Review of Systems  Constitutional: Negative for malaise/fatigue.  Respiratory: Negative for shortness of breath.   Gastrointestinal: Positive for heartburn.  Psychiatric/Behavioral: Positive for depression.       Objective:   Physical Exam Vitals reviewed.  Constitutional:      General: She is irritable. She is not in acute distress.    Appearance: She is well-developed and well-nourished.  HENT:     Head: Normocephalic and atraumatic.     Right Ear: Tympanic membrane normal.     Left Ear: Tympanic membrane normal.     Mouth/Throat:     Mouth: Oropharynx is clear and moist.  Eyes:     Pupils: Pupils are equal, round, and reactive to light.  Neck:     Thyroid: No thyromegaly.  Cardiovascular:     Rate and Rhythm: Normal rate and regular rhythm.     Pulses: Intact distal pulses.     Heart sounds: Normal heart sounds. No murmur heard.   Pulmonary:     Effort: Pulmonary effort is normal. No respiratory distress.     Breath sounds: Normal breath sounds. No wheezing.  Abdominal:     General: Bowel sounds are normal. There is no distension.     Palpations: Abdomen is soft.     Tenderness: There is no abdominal tenderness.  Musculoskeletal:        General: No tenderness or edema. Normal range of motion.     Cervical back: Normal range of motion and neck supple.     Comments: Positive  for tinel and Phalen sign   Skin:    General: Skin is warm and dry.  Neurological:     Mental Status: She is alert and oriented to person, place, and time.     Cranial Nerves: No cranial nerve deficit.     Deep Tendon Reflexes: Reflexes are normal and symmetric.  Psychiatric:        Mood and Affect: Mood and affect normal.        Behavior: Behavior normal.        Thought Content: Thought content normal.        Judgment: Judgment normal.       BP 135/64   Pulse 80   Temp 98 F (36.7 C) (Temporal)   Ht  '5\' 2"'  (1.575 m)   Wt 180 lb (81.6 kg)   LMP 08/13/2001   SpO2 99%   BMI 32.92 kg/m      Assessment & Plan:  Gina Murray comes in today with chief complaint of Medical Management of Chronic Issues, Hypertension, Numbness (In right thumb been going on for a couple a months.), and Gastroesophageal Reflux (Wants to go back on omeprazole )   Diagnosis and orders addressed:  1. Essential hypertension - metoprolol succinate (TOPROL-XL) 100 MG 24 hr tablet; TAKE WITH OR IMMEDIATELY FOLLOWING A MEAL 1 TABLET ONCE DAILY.  Dispense: 90 tablet; Refill: 0 - lisinopril (ZESTRIL) 20 MG tablet; Take 1 tablet (20 mg total) by mouth 2 (two) times daily. (Needs to be seen before next refill)  Dispense: 90 tablet; Refill: 2 - amLODipine (NORVASC) 10 MG tablet; Take 1 tablet (10 mg total) by mouth daily.  Dispense: 90 tablet; Refill: 0 - CMP14+EGFR  2. Hyperlipidemia, unspecified hyperlipidemia type - atorvastatin (LIPITOR) 80 MG tablet; Take 1 tablet (80 mg total) by mouth daily.  Dispense: 90 tablet; Refill: 1 - CMP14+EGFR  3. Primary hypertension - CMP14+EGFR  4. Gastroesophageal reflux disease, unspecified whether esophagitis present - omeprazole (PRILOSEC) 20 MG capsule; Take 1 capsule (20 mg total) by mouth daily.  Dispense: 30 capsule; Refill: 3 - CMP14+EGFR  5. Depression, major, single episode, mild (HCC) - CMP14+EGFR  6. Obesity (BMI 30-39.9) - CMP14+EGFR  7. Bilateral carpal tunnel syndrome Start wearing bilateral splints at night Start diclofenac BID with food - CMP14+EGFR - diclofenac (VOLTAREN) 75 MG EC tablet; Take 1 tablet (75 mg total) by mouth 2 (two) times daily.  Dispense: 60 tablet; Refill: 2   Labs pending Health Maintenance reviewed Diet and exercise encouraged  Follow up plan: 6 months   Evelina Dun, FNP

## 2020-03-09 LAB — CMP14+EGFR
ALT: 24 IU/L (ref 0–32)
AST: 26 IU/L (ref 0–40)
Albumin/Globulin Ratio: 1.6 (ref 1.2–2.2)
Albumin: 4.4 g/dL (ref 3.8–4.8)
Alkaline Phosphatase: 162 IU/L — ABNORMAL HIGH (ref 44–121)
BUN/Creatinine Ratio: 16 (ref 12–28)
BUN: 15 mg/dL (ref 8–27)
Bilirubin Total: 0.8 mg/dL (ref 0.0–1.2)
CO2: 23 mmol/L (ref 20–29)
Calcium: 9.9 mg/dL (ref 8.7–10.3)
Chloride: 100 mmol/L (ref 96–106)
Creatinine, Ser: 0.91 mg/dL (ref 0.57–1.00)
GFR calc Af Amer: 77 mL/min/{1.73_m2} (ref >59)
GFR calc non Af Amer: 67 mL/min/{1.73_m2} (ref >59)
Globulin, Total: 2.7 g/dL (ref 1.5–4.5)
Glucose: 95 mg/dL (ref 65–99)
Potassium: 4.5 mmol/L (ref 3.5–5.2)
Sodium: 143 mmol/L (ref 134–144)
Total Protein: 7.1 g/dL (ref 6.0–8.5)

## 2020-07-01 ENCOUNTER — Other Ambulatory Visit: Payer: Self-pay | Admitting: Family

## 2020-07-01 DIAGNOSIS — I10 Essential (primary) hypertension: Secondary | ICD-10-CM

## 2020-08-23 ENCOUNTER — Encounter: Payer: Self-pay | Admitting: *Deleted

## 2020-08-24 DIAGNOSIS — Z01419 Encounter for gynecological examination (general) (routine) without abnormal findings: Secondary | ICD-10-CM | POA: Diagnosis not present

## 2020-08-24 DIAGNOSIS — Z1231 Encounter for screening mammogram for malignant neoplasm of breast: Secondary | ICD-10-CM | POA: Diagnosis not present

## 2020-08-24 DIAGNOSIS — Z6832 Body mass index (BMI) 32.0-32.9, adult: Secondary | ICD-10-CM | POA: Diagnosis not present

## 2020-09-07 ENCOUNTER — Ambulatory Visit: Payer: BLUE CROSS/BLUE SHIELD | Admitting: Family

## 2020-09-27 ENCOUNTER — Other Ambulatory Visit: Payer: Self-pay | Admitting: Family

## 2020-09-27 DIAGNOSIS — E785 Hyperlipidemia, unspecified: Secondary | ICD-10-CM

## 2020-09-27 DIAGNOSIS — I10 Essential (primary) hypertension: Secondary | ICD-10-CM

## 2020-10-04 ENCOUNTER — Encounter: Payer: Self-pay | Admitting: Family

## 2020-10-04 ENCOUNTER — Ambulatory Visit: Payer: BLUE CROSS/BLUE SHIELD | Admitting: Family

## 2020-10-04 ENCOUNTER — Other Ambulatory Visit: Payer: Self-pay

## 2020-10-04 VITALS — BP 141/72 | HR 72 | Temp 97.9°F | Ht 62.0 in | Wt 182.6 lb

## 2020-10-04 DIAGNOSIS — Z0000 Encounter for general adult medical examination without abnormal findings: Secondary | ICD-10-CM | POA: Diagnosis not present

## 2020-10-04 DIAGNOSIS — F320 Major depressive disorder, single episode, mild: Secondary | ICD-10-CM | POA: Diagnosis not present

## 2020-10-04 DIAGNOSIS — Z23 Encounter for immunization: Secondary | ICD-10-CM

## 2020-10-04 DIAGNOSIS — Z0001 Encounter for general adult medical examination with abnormal findings: Secondary | ICD-10-CM

## 2020-10-04 DIAGNOSIS — E669 Obesity, unspecified: Secondary | ICD-10-CM

## 2020-10-04 DIAGNOSIS — I10 Essential (primary) hypertension: Secondary | ICD-10-CM

## 2020-10-04 DIAGNOSIS — L989 Disorder of the skin and subcutaneous tissue, unspecified: Secondary | ICD-10-CM

## 2020-10-04 DIAGNOSIS — R531 Weakness: Secondary | ICD-10-CM

## 2020-10-04 DIAGNOSIS — E785 Hyperlipidemia, unspecified: Secondary | ICD-10-CM | POA: Diagnosis not present

## 2020-10-04 DIAGNOSIS — K219 Gastro-esophageal reflux disease without esophagitis: Secondary | ICD-10-CM

## 2020-10-04 DIAGNOSIS — L570 Actinic keratosis: Secondary | ICD-10-CM | POA: Diagnosis not present

## 2020-10-04 DIAGNOSIS — L089 Local infection of the skin and subcutaneous tissue, unspecified: Secondary | ICD-10-CM

## 2020-10-04 NOTE — Patient Instructions (Signed)
Actinic Keratosis °An actinic keratosis is a precancerous growth on the skin. If there is more than one growth, the condition is called actinic keratoses. Actinic keratoses appear most often on areas of skin that get a lot of sun exposure, including the scalp, face, ears, lips, upper back, forearms, and the backs of the hands. °If left untreated, these growths may develop into a skin cancer called squamous cell carcinoma. It is important to have all these growths checked by a health care provider to determine the best treatment approach. °What are the causes? °Actinic keratoses are caused by getting too much ultraviolet (UV) radiation from the sun or other UV light sources. °What increases the risk? °You are more likely to develop this condition if you: °Have light-colored skin and blue eyes. °Have blond or red hair. °Spend a lot of time in the sun. °Do not protect your skin from the sun when outdoors. °Are an older person. The risk of developing an actinic keratosis increases with age. °What are the signs or symptoms? °Actinic keratoses feel like scaly, rough spots of skin. Symptoms of this condition include growths that may: °Be as small as a pinhead or as big as a quarter. °Itch, hurt, or feel sensitive. °Be skin-colored, light tan, dark tan, pink, or a combination of any of these colors. In most cases, the growths become red. °Have a small piece of pink or gray skin (skin tag) growing from them. °It may be easier to notice actinic keratoses by feeling them, rather than seeing them. Sometimes, actinic keratoses disappear, but many reappear a few days to a few weeks later. °How is this diagnosed? °This condition is usually diagnosed with a physical exam. °A tissue sample may be removed from the actinic keratosis and examined under a microscope (biopsy). °How is this treated? °If needed, this condition may be treated by: °Scraping off the actinic keratosis (curettage). °Freezing the actinic keratosis with liquid  nitrogen (cryosurgery). This causes the growth to eventually fall off the skin. °Applying medicated creams or gels to destroy the cells in the growth. °Applying chemicals to the actinic keratosis to make the outer layers of skin peel off (chemical peel). °Using photodynamic therapy. In this procedure, medicated cream is applied to the actinic keratosis. This cream increases your skin's sensitivity to light. Then, a strong light is aimed at the actinic keratosis to destroy cells in the growth. °Follow these instructions at home: °Skin care °Apply cool, wet cloths (cool compresses) to the affected areas. °Do not scratch your skin. °Check your skin regularly for any growths, especially growths that: °Start to itch or bleed. °Change in size, shape, or color. °Caring for the treated area °Keep the treated area clean and dry as told by your health care provider. °Do not apply any medicine, cream, or lotion to the treated area unless your health care provider tells you to do that. °Do not pick at blisters or try to break them open. This can cause infection and scarring. °If you have red or irritated skin after treatment, follow instructions from your health care provider about how to take care of the treated area. Make sure you: °Wash your hands with soap and water before you change your bandage (dressing). If soap and water are not available, use hand sanitizer. °Change your dressing as told by your health care provider. °If you have red or irritated skin after treatment, check your treated area every day for signs of infection. Check for: °Redness, swelling, or pain. °Fluid or blood. °  Warmth. °Pus or a bad smell. °General instructions °Take or apply over-the-counter and prescription medicines only as told by your health care provider. °Return to your normal activities as told by your health care provider. Ask your health care provider what activities are safe for you. °Have a skin exam done every year by a health care  provider who is a skin specialist (dermatologist). °Keep all follow-up visits as told by your health care provider. This is important. °Lifestyle °Do not use any products that contain nicotine or tobacco, such as cigarettes and e-cigarettes. If you need help quitting, ask your health care provider. °Take steps to protect your skin from the sun. °Try to avoid the sun between 10:00 a.m. and 4:00 p.m. This is when the UV light is the strongest. °Use a sunscreen or sunblock with SPF 30 (sun protection factor 30) or greater. °Apply sunscreen before you are exposed to sunlight and reapply as often as directed by the instructions on the sunscreen container. °Always wear sunglasses that have UV protection, and always wear a hat and clothing to protect your skin from sunlight. °When possible, avoid medicines that increase your sensitivity to sunlight. °Do not use tanning beds or other indoor tanning devices. °Contact a health care provider if: °You notice any changes or new growths on your skin. °You have swelling, pain, or more redness around your treated area. °You have fluid or blood coming from your treated area. °Your treated area feels warm to the touch. °You have pus or a bad smell coming from your treated area. °You have a fever. °You have a blister that becomes large and painful. °Summary °An actinic keratosis is a precancerous growth on the skin. If there is more than one growth, the condition is called actinic keratoses. In some cases, if left untreated, these growths can develop into skin cancer. °Check your skin regularly for any growths, especially growths that start to itch or bleed, or change in size, shape, or color. °Take steps to protect your skin from the sun. °Contact a health care provider if you notice any changes or new growths on your skin. °Keep all follow-up visits as told by your health care provider. This is important. °This information is not intended to replace advice given to you by your  health care provider. Make sure you discuss any questions you have with your health care provider. °Document Revised: 07/21/2017 Document Reviewed: 07/21/2017 °Elsevier Patient Education © 2022 [employer] ° °

## 2020-10-04 NOTE — Progress Notes (Signed)
Subjective:    Patient ID: Gina Murray, female    DOB: January 17, 1956, 65 y.o.   MRN: [id number]  Chief Complaint  Patient presents with   Medical Management of Chronic Issues    Spot on left side of temple. And spot on right nipple. Noticed last week. Was tender at first. Declines drainage.    Pt presents to the office today for  CPE and chronic follow up. She is followed by Gyn annually .Had her Dexa scan 08/05/19 that showed Osteopenia. She is taking Calcium and Vit D.  PT complaining of a "pimple" on her right nipple that she noticed last week. Has used hot compresses with mild relief.  She is also complaining of decrease strength. States she does not feel as strong as she should be.   She is complaining of skin lesion on left temporal area.  Hypertension This is a chronic problem. The current episode started more than 1 year ago. The problem has been waxing and waning since onset. The problem is controlled. Associated symptoms include peripheral edema ("some times"). Pertinent negatives include no malaise/fatigue or shortness of breath. Risk factors for coronary artery disease include dyslipidemia, obesity and sedentary lifestyle. The current treatment provides moderate improvement.  Gastroesophageal Reflux She complains of belching and heartburn. This is a chronic problem. The current episode started more than 1 year ago. The problem occurs occasionally. The problem has been waxing and waning. The symptoms are aggravated by certain foods. Risk factors include obesity. She has tried a PPI for the symptoms. The treatment provided moderate relief.  Hyperlipidemia This is a chronic problem. The current episode started more than 1 year ago. Exacerbating diseases include obesity. Pertinent negatives include no shortness of breath. Current antihyperlipidemic treatment includes statins. The current treatment provides moderate improvement of lipids. Risk factors for coronary artery disease  include dyslipidemia, hypertension and a sedentary lifestyle.  Depression        This is a chronic problem.  The current episode started more than 1 year ago.   The onset quality is gradual.   The problem occurs intermittently.  Associated symptoms include helplessness, hopelessness, irritable, restlessness and sad.    Review of Systems  Constitutional:  Negative for malaise/fatigue.  Respiratory:  Negative for shortness of breath.   Gastrointestinal:  Positive for heartburn.  Psychiatric/Behavioral:  Positive for depression.   All other systems reviewed and are negative.  Family History  Problem Relation Age of Onset   Hypertension Mother    Heart disease Mother 79   Atrial fibrillation Mother    Kidney disease Father        Dialysis   Lung cancer Sister    Social History   Socioeconomic History   Marital status: Married    Spouse name: Not on file   Number of children: 1   Years of education: Not on file   Highest education level: Not on file  Occupational History   Not on file  Tobacco Use   Smoking status: Never   Smokeless tobacco: Never  Vaping Use   Vaping Use: Never used  Substance and Sexual Activity   Alcohol use: No   Drug use: No   Sexual activity: Not on file  Other Topics Concern   Not on file  Social History Narrative   Not on file   Social Determinants of Health   Financial Resource Strain: Not on file  Food Insecurity: Not on file  Transportation Needs: Not on file  Physical Activity:  Not on file  Stress: Not on file  Social Connections: Not on file       Objective:   Physical Exam Vitals reviewed.  Constitutional:      General: She is irritable. She is not in acute distress.    Appearance: She is well-developed. She is obese.  HENT:     Head: Normocephalic and atraumatic.      Comments: Small rough, discolored skin lesion    Right Ear: Tympanic membrane normal.     Left Ear: Tympanic membrane normal.  Eyes:     Pupils: Pupils are  equal, round, and reactive to light.  Neck:     Thyroid: No thyromegaly.  Cardiovascular:     Rate and Rhythm: Normal rate and regular rhythm.     Heart sounds: Normal heart sounds. No murmur heard. Pulmonary:     Effort: Pulmonary effort is normal. No respiratory distress.     Breath sounds: Normal breath sounds. No wheezing.     Comments: Small pustule on right nipple.  Abdominal:     General: Bowel sounds are normal. There is no distension.     Palpations: Abdomen is soft.     Tenderness: There is no abdominal tenderness.  Musculoskeletal:        General: No tenderness. Normal range of motion.     Cervical back: Normal range of motion and neck supple.  Skin:    General: Skin is warm and dry.  Neurological:     Mental Status: She is alert and oriented to person, place, and time.     Cranial Nerves: No cranial nerve deficit.     Deep Tendon Reflexes: Reflexes are normal and symmetric.  Psychiatric:        Behavior: Behavior normal.        Thought Content: Thought content normal.        Judgment: Judgment normal.    Small amount of pressure applied on right nipple and scant amount of white discharge removed. No redness, warmth, or fever.    Cryotherapy used on skin lesion on temporal area. Pt tolerated well.   BP (!) 141/72   Pulse 72   Temp 97.9 F (36.6 C) (Temporal)   Ht '5\' 2"'  (1.575 m)   Wt 182 lb 9.6 oz (82.8 kg)   LMP 08/13/2001   SpO2 98%   BMI 33.40 kg/m      Assessment & Plan:  KARESSA ONORATO comes in today with chief complaint of Medical Management of Chronic Issues (Spot on left side of temple. And spot on right nipple. Noticed last week. Was tender at first. Declines drainage.)   Diagnosis and orders addressed:  1. Primary hypertension - CMP14+EGFR - CBC with Differential/Platelet  2. Gastroesophageal reflux disease, unspecified whether esophagitis present - CMP14+EGFR - CBC with Differential/Platelet  3. Depression, major, single episode, mild  (HCC) - CMP14+EGFR - CBC with Differential/Platelet  4. Hyperlipidemia, unspecified hyperlipidemia type - CMP14+EGFR - CBC with Differential/Platelet  5. Obesity (BMI 30-39.9) - CMP14+EGFR - CBC with Differential/Platelet  6. Decreased strength -Encouraged to get 5 lb weight and do arm exercises  - CMP14+EGFR - CBC with Differential/Platelet  7. Pustule - CMP14+EGFR - CBC with Differential/Platelet  8. Skin lesion - CMP14+EGFR - CBC with Differential/Platelet  9. Actinic keratosis -Cryotherapy used Do not pick at area - CMP14+EGFR - CBC with Differential/Platelet  10. Annual physical exam - CMP14+EGFR - CBC with Differential/Platelet - Lipid panel - TSH   Labs pending Health Maintenance reviewed  Diet and exercise encouraged  Follow up plan: 6 months   Evelina Dun, FNP

## 2020-10-05 LAB — CMP14+EGFR
ALT: 24 IU/L (ref 0–32)
AST: 27 IU/L (ref 0–40)
Albumin/Globulin Ratio: 1.6 (ref 1.2–2.2)
Albumin: 4.5 g/dL (ref 3.8–4.8)
Alkaline Phosphatase: 158 IU/L — ABNORMAL HIGH (ref 44–121)
BUN/Creatinine Ratio: 16 (ref 12–28)
BUN: 16 mg/dL (ref 8–27)
Bilirubin Total: 0.5 mg/dL (ref 0.0–1.2)
CO2: 24 mmol/L (ref 20–29)
Calcium: 10 mg/dL (ref 8.7–10.3)
Chloride: 101 mmol/L (ref 96–106)
Creatinine, Ser: 1.01 mg/dL — ABNORMAL HIGH (ref 0.57–1.00)
Globulin, Total: 2.8 g/dL (ref 1.5–4.5)
Glucose: 108 mg/dL — ABNORMAL HIGH (ref 65–99)
Potassium: 4.9 mmol/L (ref 3.5–5.2)
Sodium: 140 mmol/L (ref 134–144)
Total Protein: 7.3 g/dL (ref 6.0–8.5)
eGFR: 62 mL/min/{1.73_m2} (ref >59)

## 2020-10-05 LAB — CBC WITH DIFFERENTIAL/PLATELET
Basophils Absolute: 0.1 10*3/uL (ref 0.0–0.2)
Basos: 1 %
EOS (ABSOLUTE): 0.4 10*3/uL (ref 0.0–0.4)
Eos: 6 %
Hematocrit: 39.3 % (ref 34.0–46.6)
Hemoglobin: 12.6 g/dL (ref 11.1–15.9)
Immature Grans (Abs): 0 10*3/uL (ref 0.0–0.1)
Immature Granulocytes: 1 %
Lymphocytes Absolute: 2.2 10*3/uL (ref 0.7–3.1)
Lymphs: 34 %
MCH: 27.3 pg (ref 26.6–33.0)
MCHC: 32.1 g/dL (ref 31.5–35.7)
MCV: 85 fL (ref 79–97)
Monocytes Absolute: 0.6 10*3/uL (ref 0.1–0.9)
Monocytes: 9 %
Neutrophils Absolute: 3.2 10*3/uL (ref 1.4–7.0)
Neutrophils: 49 %
Platelets: 312 10*3/uL (ref 150–450)
RBC: 4.62 x10E6/uL (ref 3.77–5.28)
RDW: 14.8 % (ref 11.7–15.4)
WBC: 6.4 10*3/uL (ref 3.4–10.8)

## 2020-10-05 LAB — LIPID PANEL
Chol/HDL Ratio: 2.3 ratio (ref 0.0–4.4)
Cholesterol, Total: 187 mg/dL (ref 100–199)
HDL: 83 mg/dL (ref >39)
LDL Chol Calc (NIH): 89 mg/dL (ref 0–99)
Triglycerides: 84 mg/dL (ref 0–149)
VLDL Cholesterol Cal: 15 mg/dL (ref 5–40)

## 2020-10-05 LAB — TSH: TSH: 1.15 u[IU]/mL (ref 0.450–4.500)

## 2020-10-09 ENCOUNTER — Other Ambulatory Visit: Payer: Self-pay | Admitting: Family

## 2020-11-12 ENCOUNTER — Ambulatory Visit: Payer: BLUE CROSS/BLUE SHIELD | Admitting: Nurse Practitioner

## 2020-11-12 ENCOUNTER — Encounter: Payer: Self-pay | Admitting: Nurse Practitioner

## 2020-11-12 DIAGNOSIS — U071 COVID-19: Secondary | ICD-10-CM

## 2020-11-12 MED ORDER — MOLNUPIRAVIR EUA 200MG CAPSULE: 4.0000 | ORAL_CAPSULE | Freq: Two times a day (BID) | ORAL | 0 refills | Status: AC

## 2020-11-12 NOTE — Progress Notes (Signed)
Virtual Visit  Note Due to COVID-19 pandemic this visit was conducted virtually. This visit type was conducted due to national recommendations for restrictions regarding the COVID-19 Pandemic (e.g. social distancing, sheltering in place) in an effort to limit this patient's exposure and mitigate transmission in our community. All issues noted in this document were discussed and addressed.  A physical exam was not performed with this format.  I connected with Gina Murray on 11/12/20 at 12:42 by telephone and verified that I am speaking with the correct person using two identifiers. Gina Murray is currently located at home and no one is currently with her during visit. The provider, Mary-Margaret Hassell Done, FNP is located in their office at time of visit.  I discussed the limitations, risks, security and privacy concerns of performing an evaluation and management service by telephone and the availability of in person appointments. I also discussed with the patient that there may be a patient responsible charge related to this service. The patient expressed understanding and agreed to proceed.   History and Present Illness:   Chief Complaint: Covid Positive   HPI Patient states that she developed stuffy nose on Saturday. Has spread to sneezing, coughing and watery eyes. Has fever of 100 degrees Saturday night. Fever is low grade around 99 now. She did a home test this morning and it was positive.     Review of Systems  Constitutional:  Positive for fever (low grade) and malaise/fatigue.  HENT:  Positive for congestion. Negative for sore throat.   Respiratory:  Positive for cough. Negative for sputum production and shortness of breath.   Musculoskeletal:  Negative for myalgias.  Neurological:  Negative for headaches.    Observations/Objective: Alert and oriented- answers all questions appropriately No distress Voice hoarse dry cough during visit  Assessment and Plan: Gina Murray in today with chief complaint of Covid Positive   1. Lab test positive for detection of COVID-19 virus 1. Take meds as prescribed 2. Use a cool mist humidifier especially during the winter months and when heat has been humid. 3. Use saline nose sprays frequently 4. Saline irrigations of the nose can be very helpful if done frequently.  * 4X daily for 1 week*  * Use of a nettie pot can be helpful with this. Follow directions with this* 5. Drink plenty of fluids 6. Keep thermostat turn down low 7.For any cough or congestion  Use plain Mucinex- regular strength or max strength is fine   * Children- consult with Pharmacist for dosing 8. For fever or aces or pains- take tylenol or ibuprofen appropriate for age and weight.  * for fevers greater than 101 orally you may alternate ibuprofen and tylenol every  3 hours.   Meds ordered this encounter  Medications   molnupiravir EUA 200 mg CAPS    Sig: Take 4 capsules (800 mg total) by mouth 2 (two) times daily for 5 days.    Dispense:  40 capsule    Refill:  0    Order Specific Question:   Supervising Provider    Answer:   Caryl Pina A [id number]        Follow Up Instructions: prn    I discussed the assessment and treatment plan with the patient. The patient was provided an opportunity to ask questions and all were answered. The patient agreed with the plan and demonstrated an understanding of the instructions.   The patient was advised to call back or seek an in-person  evaluation if the symptoms worsen or if the condition fails to improve as anticipated.  The above assessment and management plan was discussed with the patient. The patient verbalized understanding of and has agreed to the management plan. Patient is aware to call the clinic if symptoms persist or worsen. Patient is aware when to return to the clinic for a follow-up visit. Patient educated on when it is appropriate to go to the emergency department.   Time  call ended:  12:55  I provide 13 minutes of  non face-to-face time during this encounter.    Mary-Margaret Hassell Done, FNP

## 2020-12-24 ENCOUNTER — Other Ambulatory Visit: Payer: Self-pay | Admitting: Family

## 2020-12-24 DIAGNOSIS — I10 Essential (primary) hypertension: Secondary | ICD-10-CM

## 2021-01-03 ENCOUNTER — Other Ambulatory Visit: Payer: Self-pay

## 2021-01-03 ENCOUNTER — Ambulatory Visit (INDEPENDENT_AMBULATORY_CARE_PROVIDER_SITE_OTHER): Payer: BLUE CROSS/BLUE SHIELD

## 2021-01-03 DIAGNOSIS — Z23 Encounter for immunization: Secondary | ICD-10-CM

## 2021-03-21 ENCOUNTER — Other Ambulatory Visit: Payer: Self-pay | Admitting: Family

## 2021-03-21 DIAGNOSIS — E785 Hyperlipidemia, unspecified: Secondary | ICD-10-CM

## 2021-03-21 DIAGNOSIS — I10 Essential (primary) hypertension: Secondary | ICD-10-CM

## 2021-04-08 ENCOUNTER — Ambulatory Visit (INDEPENDENT_AMBULATORY_CARE_PROVIDER_SITE_OTHER): Payer: Medicare Other | Admitting: Family

## 2021-04-08 ENCOUNTER — Encounter: Payer: Self-pay | Admitting: Family

## 2021-04-08 VITALS — BP 136/64 | HR 70 | Temp 98.6°F | Ht 62.0 in | Wt 175.0 lb

## 2021-04-08 DIAGNOSIS — B351 Tinea unguium: Secondary | ICD-10-CM | POA: Diagnosis not present

## 2021-04-08 DIAGNOSIS — K219 Gastro-esophageal reflux disease without esophagitis: Secondary | ICD-10-CM | POA: Diagnosis not present

## 2021-04-08 DIAGNOSIS — E785 Hyperlipidemia, unspecified: Secondary | ICD-10-CM | POA: Diagnosis not present

## 2021-04-08 DIAGNOSIS — I10 Essential (primary) hypertension: Secondary | ICD-10-CM

## 2021-04-08 DIAGNOSIS — F320 Major depressive disorder, single episode, mild: Secondary | ICD-10-CM

## 2021-04-08 DIAGNOSIS — E669 Obesity, unspecified: Secondary | ICD-10-CM | POA: Diagnosis not present

## 2021-04-08 LAB — CBC WITH DIFFERENTIAL/PLATELET
Basophils Absolute: 0.1 10*3/uL (ref 0.0–0.2)
Basos: 1 %
EOS (ABSOLUTE): 0.4 10*3/uL (ref 0.0–0.4)
Eos: 5 %
Hematocrit: 38.9 % (ref 34.0–46.6)
Hemoglobin: 12.5 g/dL (ref 11.1–15.9)
Immature Grans (Abs): 0 10*3/uL (ref 0.0–0.1)
Immature Granulocytes: 0 %
Lymphocytes Absolute: 2.1 10*3/uL (ref 0.7–3.1)
Lymphs: 30 %
MCH: 27.4 pg (ref 26.6–33.0)
MCHC: 32.1 g/dL (ref 31.5–35.7)
MCV: 85 fL (ref 79–97)
Monocytes Absolute: 0.6 10*3/uL (ref 0.1–0.9)
Monocytes: 8 %
Neutrophils Absolute: 3.9 10*3/uL (ref 1.4–7.0)
Neutrophils: 56 %
Platelets: 293 10*3/uL (ref 150–450)
RBC: 4.56 x10E6/uL (ref 3.77–5.28)
RDW: 15 % (ref 11.7–15.4)
WBC: 7 10*3/uL (ref 3.4–10.8)

## 2021-04-08 LAB — CMP14+EGFR
ALT: 20 IU/L (ref 0–32)
AST: 22 IU/L (ref 0–40)
Albumin/Globulin Ratio: 2 (ref 1.2–2.2)
Albumin: 4.5 g/dL (ref 3.8–4.8)
Alkaline Phosphatase: 157 IU/L — ABNORMAL HIGH (ref 44–121)
BUN/Creatinine Ratio: 25 (ref 12–28)
BUN: 24 mg/dL (ref 8–27)
Bilirubin Total: 0.3 mg/dL (ref 0.0–1.2)
CO2: 25 mmol/L (ref 20–29)
Calcium: 10.1 mg/dL (ref 8.7–10.3)
Chloride: 105 mmol/L (ref 96–106)
Creatinine, Ser: 0.95 mg/dL (ref 0.57–1.00)
Globulin, Total: 2.3 g/dL (ref 1.5–4.5)
Glucose: 101 mg/dL — ABNORMAL HIGH (ref 70–99)
Potassium: 4.8 mmol/L (ref 3.5–5.2)
Sodium: 143 mmol/L (ref 134–144)
Total Protein: 6.8 g/dL (ref 6.0–8.5)
eGFR: 66 mL/min/{1.73_m2} (ref >59)

## 2021-04-08 MED ORDER — TERBINAFINE HCL 250 MG PO TABS: 250.0000 mg | ORAL_TABLET | Freq: Every day | ORAL | 0 refills | Status: DC

## 2021-04-08 NOTE — Patient Instructions (Signed)

## 2021-04-08 NOTE — Progress Notes (Signed)
Subjective:    Patient ID: Gina Murray, female    DOB: November 14, 1955, 66 y.o.   MRN: [id number]  Chief Complaint  Patient presents with   Medical Management of Chronic Issues   Pt presents to the office today for chronic follow up. She is followed by Gyn annually. Had her Dexa scan 08/05/19 that showed Osteopenia. She is taking Calcium and Vit D.  Her BP is slightly elevated today, but home BP is 136/64.   She is complaining of left foot great toe nail changes and thickness.  Hypertension This is a chronic problem. The current episode started more than 1 year ago. The problem has been resolved since onset. The problem is controlled. Pertinent negatives include no malaise/fatigue, peripheral edema or shortness of breath. Risk factors for coronary artery disease include dyslipidemia and obesity. The current treatment provides moderate improvement. There is no history of heart failure.  Gastroesophageal Reflux She complains of belching and heartburn. This is a chronic problem. The current episode started more than 1 year ago. The problem occurs occasionally. Risk factors include obesity. She has tried a PPI for the symptoms. The treatment provided moderate relief.  Hyperlipidemia This is a chronic problem. The current episode started more than 1 year ago. The problem is controlled. Exacerbating diseases include obesity. Pertinent negatives include no shortness of breath. Current antihyperlipidemic treatment includes statins. The current treatment provides moderate improvement of lipids. Risk factors for coronary artery disease include hypertension, a sedentary lifestyle, post-menopausal and dyslipidemia.  Depression        This is a chronic problem.  The current episode started more than 1 year ago.   The onset quality is gradual.   The problem occurs intermittently.  Associated symptoms include decreased interest and sad.  Associated symptoms include no helplessness, no hopelessness, not  irritable and no restlessness.  Past treatments include nothing.    Review of Systems  Constitutional:  Negative for malaise/fatigue.  Respiratory:  Negative for shortness of breath.   Gastrointestinal:  Positive for heartburn.  Psychiatric/Behavioral:  Positive for depression.   All other systems reviewed and are negative.     Objective:   Physical Exam Vitals reviewed.  Constitutional:      General: She is not irritable.She is not in acute distress.    Appearance: She is well-developed. She is obese.  HENT:     Head: Normocephalic and atraumatic.     Right Ear: Tympanic membrane normal.     Left Ear: Tympanic membrane normal.  Eyes:     Pupils: Pupils are equal, round, and reactive to light.  Neck:     Thyroid: No thyromegaly.  Cardiovascular:     Rate and Rhythm: Normal rate and regular rhythm.     Heart sounds: Normal heart sounds. No murmur heard. Pulmonary:     Effort: Pulmonary effort is normal. No respiratory distress.     Breath sounds: Normal breath sounds. No wheezing.  Abdominal:     General: Bowel sounds are normal. There is no distension.     Palpations: Abdomen is soft.     Tenderness: There is no abdominal tenderness.  Musculoskeletal:        General: No tenderness. Normal range of motion.     Cervical back: Normal range of motion and neck supple.  Skin:    General: Skin is warm and dry.     Comments: Left great toe nail and second toe nail thickness and color changes  Neurological:  Mental Status: She is alert and oriented to person, place, and time.     Cranial Nerves: No cranial nerve deficit.     Deep Tendon Reflexes: Reflexes are normal and symmetric.  Psychiatric:        Behavior: Behavior normal.        Thought Content: Thought content normal.        Judgment: Judgment normal.      BP (!) 148/66    Pulse 70    Temp 98.6 F (37 C) (Oral)    Ht '5\' 2"'  (1.575 m)    Wt 175 lb (79.4 kg)    LMP 08/13/2001    BMI 32.01 kg/m      Assessment  & Plan:  Gina Murray comes in today with chief complaint of Medical Management of Chronic Issues   Diagnosis and orders addressed:  1. Primary hypertension - CMP14+EGFR - CBC with Differential/Platelet  2. Gastroesophageal reflux disease, unspecified whether esophagitis present - CMP14+EGFR - CBC with Differential/Platelet  3. Obesity (BMI 30-39.9) - CMP14+EGFR - CBC with Differential/Platelet  4. Hyperlipidemia, unspecified hyperlipidemia type  - CMP14+EGFR - CBC with Differential/Platelet  5. Depression, major, single episode, mild (HCC) - CMP14+EGFR - CBC with Differential/Platelet  6. Onychomycosis Start Lamisil  Do not walk bare foot - terbinafine (LAMISIL) 250 MG tablet; Take 1 tablet (250 mg total) by mouth daily.  Dispense: 90 tablet; Refill: 0  Labs pending Health Maintenance reviewed Diet and exercise encouraged  Follow up plan: 6 months    Evelina Dun, FNP

## 2021-06-16 ENCOUNTER — Other Ambulatory Visit: Payer: Self-pay | Admitting: Family

## 2021-06-16 DIAGNOSIS — I10 Essential (primary) hypertension: Secondary | ICD-10-CM

## 2021-06-16 DIAGNOSIS — E785 Hyperlipidemia, unspecified: Secondary | ICD-10-CM

## 2021-08-02 ENCOUNTER — Ambulatory Visit (INDEPENDENT_AMBULATORY_CARE_PROVIDER_SITE_OTHER): Payer: Medicare Other | Admitting: Nurse Practitioner

## 2021-08-02 ENCOUNTER — Encounter: Payer: Self-pay | Admitting: Nurse Practitioner

## 2021-08-02 VITALS — BP 145/70 | HR 68 | Temp 98.6°F | Ht 63.0 in | Wt 176.8 lb

## 2021-08-02 DIAGNOSIS — M79672 Pain in left foot: Secondary | ICD-10-CM | POA: Diagnosis not present

## 2021-08-02 DIAGNOSIS — M79671 Pain in right foot: Secondary | ICD-10-CM | POA: Diagnosis not present

## 2021-08-02 MED ORDER — IBUPROFEN 600 MG PO TABS: 600.0000 mg | ORAL_TABLET | Freq: Three times a day (TID) | ORAL | 0 refills | Status: DC | PRN

## 2021-08-02 NOTE — Patient Instructions (Signed)
Heel Spur  A heel spur is a bony growth that forms on the bottom of the heel bone (calcaneus). Heel spurs are common. They often cause inflammation in the plantar fascia, which is the band of tissue that connects the toe bones to the heel bone. When the plantar fascia is inflamed, it is called plantar fasciitis. This may cause pain on the bottom of the foot, near the heel. Many people with plantar fasciitis also have heel spurs. However, spurs are not the cause of plantar fasciitis pain. What are the causes? The exact cause of heel spurs is not known. They may be caused by: Pressure on the heel bone. Bands of tissues that connect muscle to bone (tendons) pulling on the heel bone. What increases the risk? You are more likely to develop this condition if you: Are older than age 40. Are overweight. Have wear-and-tear arthritis (osteoarthritis). Have plantar fascia inflammation. Participate in sports or activities that include a lot of running or jumping. Wear poorly fitted shoes. What are the signs or symptoms? Some people have no symptoms. If you do have symptoms, they may include: Pain in the bottom of your heel. Pain that is worse when you first get out of bed. Pain that gets worse after walking or standing. How is this diagnosed? This condition may be diagnosed based on: Your symptoms and medical history. A physical exam. A foot X-ray. How is this treated? Treatment for this condition depends on how much pain you have. Treatment options may include: Doing stretching exercises and losing weight, if necessary. Wearing specific shoes or inserts inside of shoes (orthotics) for comfort and support. Wearing splints on your feet while you sleep. Splints keep your feet in a position (usually a 90-degree angle) that should prevent and relieve the pain you feel when you first get out of bed. They also make stretching easier in the morning. Taking over-the-counter medicine to relieve pain, such  as NSAIDs. Using high-intensity sound waves to break up the heel spur (extracorporeal shock wave therapy). Getting steroid injections in your heel to reduce inflammation. Having surgery, if your heel spur causes long-term (chronic) pain. Follow these instructions at home:  Activity Avoid activities that cause pain until you recover, or for as long as told by your health care provider. Do stretching exercises as told. Stretch before exercising or being physically active. Managing pain, stiffness, and swelling If directed, put ice on your foot. To do this: Put ice in a plastic bag. Place a towel between your skin and the bag. Leave the ice on for 20 minutes, 2-3 times a day. Remove the ice if your skin turns bright red. This is very important. If you cannot feel pain, heat, or cold, you have a greater risk of damage to the area. Move your toes often to reduce stiffness and swelling. When possible, raise (elevate) your foot above the level of your heart while you are sitting or lying down. General instructions Take over-the-counter and prescription medicines only as told by your health care provider. Wear supportive shoes that fit well. Wear splints, inserts, or orthotics as told by your health care provider. If recommended, work with your health care provider to lose weight. This can relieve pressure on your foot. Do not use any products that contain nicotine or tobacco, such as cigarettes, e-cigarettes, and chewing tobacco. These can delay bone healing. If you need help quitting, ask your health care provider. Keep all follow-up visits. This is important. Where to find more information [employer]   [employer]: www.orthoinfo.aaos.org Contact a health care provider if: Your pain does not go away with treatment. Your pain gets worse. Summary A heel spur is a bony growth that forms on the bottom of the heel bone (calcaneus). Heel spurs often cause inflammation in the plantar  fascia, which is the band of tissue that connects the toes to the heel bone. This may cause pain on the bottom of the foot, near the heel. Doing stretching exercises, losing weight, wearing specific shoes or shoe inserts, wearing splints while you sleep, and taking pain medicine may ease the pain and stiffness. Other treatment options may include high-intensity sound waves to break up the heel spur, steroid injections, or surgery. This information is not intended to replace advice given to you by your health care provider. Make sure you discuss any questions you have with your health care provider. Document Revised: 07/05/2019 Document Reviewed: 07/05/2019 Elsevier Patient Education  2023 Elsevier Inc.  

## 2021-08-02 NOTE — Progress Notes (Signed)
? ?Acute Office Visit ? ?Subjective:  ? ?  ?Patient ID: Gina Murray, female    DOB: 11-Feb-1956, 66 y.o.   MRN: [id number] ? ?Chief Complaint  ?Patient presents with  ? Foot Injury  ?  Started off as both heels , now it is just her left heel , it has been going on for about a month , sometimes she will get a sharp pain   ? ? ?Foot Injury  ?The incident occurred 5 to 7 days ago. The incident occurred at home. There was no injury mechanism. The pain is present in the left foot and right foot. The quality of the pain is described as aching. The pain is moderate. The pain has been Intermittent since onset. Pertinent negatives include no loss of motion, loss of sensation or numbness. She reports no foreign bodies present. The symptoms are aggravated by palpation and movement. She has tried acetaminophen for the symptoms. The treatment provided mild relief.  ? ? ? ?Review of Systems  ?Constitutional: Negative.   ?Respiratory: Negative.    ?Cardiovascular: Negative.   ?Genitourinary: Negative.   ?Skin: Negative.   ?Neurological:  Negative for numbness.  ?All other systems reviewed and are negative. ? ? ?   ?Objective:  ?  ?BP (!) 145/70 Comment: high at Doc office  Pulse 68   Temp 98.6 ?F (37 ?C)   Ht '5\' 3"'$  (1.6 m)   Wt 176 lb 12.8 oz (80.2 kg)   LMP 08/13/2001   SpO2 98%   BMI 31.32 kg/m?  ? ? ?Physical Exam ?Vitals and nursing note reviewed.  ?Constitutional:   ?   Appearance: Normal appearance.  ?HENT:  ?   Head: Normocephalic.  ?   Right Ear: External ear normal.  ?   Left Ear: External ear normal.  ?   Nose: Nose normal.  ?   Mouth/Throat:  ?   Mouth: Mucous membranes are moist.  ?   Pharynx: Oropharynx is clear.  ?Eyes:  ?   Conjunctiva/sclera: Conjunctivae normal.  ?Cardiovascular:  ?   Rate and Rhythm: Normal rate and regular rhythm.  ?   Pulses: Normal pulses.  ?   Heart sounds: Normal heart sounds.  ?Pulmonary:  ?   Effort: Pulmonary effort is normal.  ?   Breath sounds: Normal breath sounds.  ?Abdominal:   ?   General: Bowel sounds are normal.  ?Musculoskeletal:     ?   General: Normal range of motion.  ?   Right lower leg: No tenderness.  ?   Left lower leg: No tenderness.  ?   Right foot: Normal range of motion. Tenderness present. No swelling, deformity or bunion.  ?   Left foot: Normal range of motion. Tenderness present. No swelling or bunion.  ?Skin: ?   General: Skin is warm.  ?   Findings: No rash.  ?Neurological:  ?   General: No focal deficit present.  ?   Mental Status: She is alert and oriented to person, place, and time.  ?Psychiatric:     ?   Behavior: Behavior normal.  ? ? ?No results found for any visits on 08/02/21. ? ? ?   ?Assessment & Plan:  ?Bilateral heel tenderness not well controlled ?-Take medication as prescribed ?-soak feet and elevate. ?- rest feet ?-comfortable shoes and heel pads recommended ?Follow up with unresolved symptoms. ? ?Problem List Items Addressed This Visit   ?None ?Visit Diagnoses   ? ? Pain of both heels    -  Primary  ? Relevant Medications  ? ibuprofen (ADVIL) 600 MG tablet  ? ?  ? ? ?Meds ordered this encounter  ?Medications  ? ibuprofen (ADVIL) 600 MG tablet  ?  Sig: Take 1 tablet (600 mg total) by mouth every 8 (eight) hours as needed.  ?  Dispense:  30 tablet  ?  Refill:  0  ?  Order Specific Question:   Supervising Provider  ?  AnswerClaretta Fraise [[id number]]  ? ? ?Return if symptoms worsen or fail to improve. ? ?Ivy Lynn, NP ? ? ?

## 2021-09-02 DIAGNOSIS — Z1231 Encounter for screening mammogram for malignant neoplasm of breast: Secondary | ICD-10-CM | POA: Diagnosis not present

## 2021-09-02 DIAGNOSIS — M8588 Other specified disorders of bone density and structure, other site: Secondary | ICD-10-CM | POA: Diagnosis not present

## 2021-09-04 ENCOUNTER — Other Ambulatory Visit: Payer: Self-pay | Admitting: Obstetrics and Gynecology

## 2021-09-04 DIAGNOSIS — R928 Other abnormal and inconclusive findings on diagnostic imaging of breast: Secondary | ICD-10-CM

## 2021-09-12 ENCOUNTER — Ambulatory Visit
Admission: RE | Admit: 2021-09-12 | Discharge: 2021-09-12 | Disposition: A | Discharge status: Home / Self Care | Payer: Self-pay | Source: Ambulatory Visit | Attending: Obstetrics and Gynecology | Admitting: Obstetrics and Gynecology

## 2021-09-12 ENCOUNTER — Ambulatory Visit
Admission: RE | Admit: 2021-09-12 | Discharge: 2021-09-12 | Disposition: A | Discharge status: Home / Self Care | Payer: Medicare Other | Source: Ambulatory Visit | Attending: Obstetrics and Gynecology | Admitting: Obstetrics and Gynecology

## 2021-09-12 DIAGNOSIS — R928 Other abnormal and inconclusive findings on diagnostic imaging of breast: Secondary | ICD-10-CM

## 2021-09-12 DIAGNOSIS — N6311 Unspecified lump in the right breast, upper outer quadrant: Secondary | ICD-10-CM | POA: Diagnosis not present

## 2021-09-12 DIAGNOSIS — N6313 Unspecified lump in the right breast, lower outer quadrant: Secondary | ICD-10-CM | POA: Diagnosis not present

## 2021-09-14 ENCOUNTER — Other Ambulatory Visit: Payer: Self-pay | Admitting: Family

## 2021-09-14 DIAGNOSIS — I10 Essential (primary) hypertension: Secondary | ICD-10-CM

## 2021-09-14 DIAGNOSIS — E785 Hyperlipidemia, unspecified: Secondary | ICD-10-CM

## 2021-09-23 DIAGNOSIS — M8580 Other specified disorders of bone density and structure, unspecified site: Secondary | ICD-10-CM | POA: Diagnosis not present

## 2021-10-07 ENCOUNTER — Ambulatory Visit (INDEPENDENT_AMBULATORY_CARE_PROVIDER_SITE_OTHER): Payer: Medicare Other | Admitting: Family

## 2021-10-07 ENCOUNTER — Encounter: Payer: Self-pay | Admitting: Family

## 2021-10-07 VITALS — BP 136/70 | HR 73 | Temp 97.5°F | Ht 63.0 in | Wt 175.2 lb

## 2021-10-07 DIAGNOSIS — Z23 Encounter for immunization: Secondary | ICD-10-CM | POA: Diagnosis not present

## 2021-10-07 DIAGNOSIS — S80862A Insect bite (nonvenomous), left lower leg, initial encounter: Secondary | ICD-10-CM

## 2021-10-07 DIAGNOSIS — K219 Gastro-esophageal reflux disease without esophagitis: Secondary | ICD-10-CM | POA: Diagnosis not present

## 2021-10-07 DIAGNOSIS — Z0001 Encounter for general adult medical examination with abnormal findings: Secondary | ICD-10-CM | POA: Diagnosis not present

## 2021-10-07 DIAGNOSIS — Z0000 Encounter for general adult medical examination without abnormal findings: Secondary | ICD-10-CM

## 2021-10-07 DIAGNOSIS — W57XXXA Bitten or stung by nonvenomous insect and other nonvenomous arthropods, initial encounter: Secondary | ICD-10-CM

## 2021-10-07 DIAGNOSIS — I10 Essential (primary) hypertension: Secondary | ICD-10-CM

## 2021-10-07 DIAGNOSIS — E785 Hyperlipidemia, unspecified: Secondary | ICD-10-CM

## 2021-10-07 DIAGNOSIS — F320 Major depressive disorder, single episode, mild: Secondary | ICD-10-CM

## 2021-10-07 DIAGNOSIS — B351 Tinea unguium: Secondary | ICD-10-CM | POA: Diagnosis not present

## 2021-10-07 DIAGNOSIS — E669 Obesity, unspecified: Secondary | ICD-10-CM

## 2021-10-07 NOTE — Progress Notes (Signed)
Subjective:    Patient ID: Gina Murray, female    DOB: 27-Jan-1956, 66 y.o.   MRN: [id number]  Chief Complaint  Patient presents with   Medical Management of Chronic Issues   Pt presents to the office today for CPE and chronic follow up. She is followed by Gyn annually. Had her Dexa scan 08/05/19 that showed Osteopenia. She is taking Calcium and Vit D.  She retired 09/20/21.  She has completed the terbinafine for her toenail fungus.States her toenail has resolved.   Reporting tick bite on left lower leg that occurred several weeks ago. Denies any fever, headache, joint pain, or rash other than around the area.  Hypertension This is a chronic problem. The current episode started more than 1 year ago. The problem has been resolved since onset. The problem is controlled. Pertinent negatives include no malaise/fatigue, peripheral edema or shortness of breath. Risk factors for coronary artery disease include dyslipidemia, obesity and sedentary lifestyle. The current treatment provides moderate improvement. There is no history of CVA or heart failure.  Gastroesophageal Reflux She complains of belching and heartburn. This is a chronic problem. The current episode started more than 1 year ago. The problem occurs occasionally. Risk factors include obesity. She has tried a PPI for the symptoms. The treatment provided moderate relief.  Hyperlipidemia This is a chronic problem. The current episode started more than 1 year ago. The problem is controlled. Recent lipid tests were reviewed and are normal. Exacerbating diseases include obesity. Pertinent negatives include no shortness of breath. Current antihyperlipidemic treatment includes statins. The current treatment provides moderate improvement of lipids. Risk factors for coronary artery disease include dyslipidemia, hypertension, a sedentary lifestyle and post-menopausal.  Depression        This is a chronic problem.  The current episode started more  than 1 year ago.   The problem occurs intermittently.  Associated symptoms include decreased interest and sad.  Associated symptoms include no helplessness, no hopelessness and no restlessness.     Review of Systems  Constitutional:  Negative for malaise/fatigue.  Respiratory:  Negative for shortness of breath.   Gastrointestinal:  Positive for heartburn.  Psychiatric/Behavioral:  Positive for depression.   All other systems reviewed and are negative.  Family History  Problem Relation Age of Onset   Hypertension Mother    Heart disease Mother 63   Atrial fibrillation Mother    Kidney disease Father        Dialysis   Lung cancer Sister    Social History   Socioeconomic History   Marital status: Married    Spouse name: Not on file   Number of children: 1   Years of education: Not on file   Highest education level: Not on file  Occupational History   Not on file  Tobacco Use   Smoking status: Never   Smokeless tobacco: Never  Vaping Use   Vaping Use: Never used  Substance and Sexual Activity   Alcohol use: No   Drug use: No   Sexual activity: Not on file  Other Topics Concern   Not on file  Social History Narrative   Not on file   Social Determinants of Health   Financial Resource Strain: Not on file  Food Insecurity: Not on file  Transportation Needs: Not on file  Physical Activity: Not on file  Stress: Not on file  Social Connections: Not on file        Objective:   Physical Exam Vitals reviewed.  Constitutional:      General: She is not in acute distress.    Appearance: She is well-developed. She is obese.  HENT:     Head: Normocephalic and atraumatic.     Right Ear: Tympanic membrane normal.     Left Ear: Tympanic membrane normal.  Eyes:     Pupils: Pupils are equal, round, and reactive to light.  Neck:     Thyroid: No thyromegaly.  Cardiovascular:     Rate and Rhythm: Normal rate and regular rhythm.     Heart sounds: Normal heart sounds. No  murmur heard. Pulmonary:     Effort: Pulmonary effort is normal. No respiratory distress.     Breath sounds: Normal breath sounds. No wheezing.  Abdominal:     General: Bowel sounds are normal. There is no distension.     Palpations: Abdomen is soft.     Tenderness: There is no abdominal tenderness.  Musculoskeletal:        General: No tenderness. Normal range of motion.     Cervical back: Normal range of motion and neck supple.  Skin:    General: Skin is warm and dry.          Comments: Small erythemas 1.5X1 on left lower leg   Neurological:     Mental Status: She is alert and oriented to person, place, and time.     Cranial Nerves: No cranial nerve deficit.     Deep Tendon Reflexes: Reflexes are normal and symmetric.  Psychiatric:        Behavior: Behavior normal.        Thought Content: Thought content normal.        Judgment: Judgment normal.       BP 136/70   Pulse 73   Temp (!) 97.5 F (36.4 C)   Ht '5\' 3"'  (1.6 m)   Wt 175 lb 3.2 oz (79.5 kg)   LMP 08/13/2001   SpO2 98%   BMI 31.04 kg/m      Assessment & Plan:  Gina Murray comes in today with chief complaint of Medical Management of Chronic Issues   Diagnosis and orders addressed:  1. Need for pneumococcal vaccination - Pneumococcal polysaccharide vaccine 23-valent greater than or equal to 2yo subcutaneous/IM - CMP14+EGFR - CBC with Differential/Platelet  2. Primary hypertension - CMP14+EGFR - CBC with Differential/Platelet  3. Gastroesophageal reflux disease, unspecified whether esophagitis present - CMP14+EGFR - CBC with Differential/Platelet  4. Depression, major, single episode, mild (HCC) - CMP14+EGFR - CBC with Differential/Platelet  5. Obesity (BMI 30-39.9) - CMP14+EGFR - CBC with Differential/Platelet  6. Hyperlipidemia, unspecified hyperlipidemia type - CMP14+EGFR - CBC with Differential/Platelet  7. Annual physical exam - CMP14+EGFR - CBC with Differential/Platelet - Lipid  panel - TSH   8. Tick bite of left lower leg, initial encounter Apply steroid cream Report any fevers, rash, headache, or joint pain  9. Toenail fungus Looking better.    Labs pending Health Maintenance reviewed Diet and exercise encouraged  Follow up plan: 6 months   Evelina Dun, FNP

## 2021-10-07 NOTE — Patient Instructions (Signed)
Health Maintenance After Age 65 After age 65, you are at a higher risk for certain long-term diseases and infections as well as injuries from falls. Falls are a major cause of broken bones and head injuries in people who are older than age 65. Getting regular preventive care can help to keep you healthy and well. Preventive care includes getting regular testing and making lifestyle changes as recommended by your health care provider. Talk with your health care provider about: Which screenings and tests you should have. A screening is a test that checks for a disease when you have no symptoms. A diet and exercise plan that is right for you. What should I know about screenings and tests to prevent falls? Screening and testing are the best ways to find a health problem early. Early diagnosis and treatment give you the best chance of managing medical conditions that are common after age 65. Certain conditions and lifestyle choices may make you more likely to have a fall. Your health care provider may recommend: Regular vision checks. Poor vision and conditions such as cataracts can make you more likely to have a fall. If you wear glasses, make sure to get your prescription updated if your vision changes. Medicine review. Work with your health care provider to regularly review all of the medicines you are taking, including over-the-counter medicines. Ask your health care provider about any side effects that may make you more likely to have a fall. Tell your health care provider if any medicines that you take make you feel dizzy or sleepy. Strength and balance checks. Your health care provider may recommend certain tests to check your strength and balance while standing, walking, or changing positions. Foot health exam. Foot pain and numbness, as well as not wearing proper footwear, can make you more likely to have a fall. Screenings, including: Osteoporosis screening. Osteoporosis is a condition that causes  the bones to get weaker and break more easily. Blood pressure screening. Blood pressure changes and medicines to control blood pressure can make you feel dizzy. Depression screening. You may be more likely to have a fall if you have a fear of falling, feel depressed, or feel unable to do activities that you used to do. Alcohol use screening. Using too much alcohol can affect your balance and may make you more likely to have a fall. Follow these instructions at home: Lifestyle Do not drink alcohol if: Your health care provider tells you not to drink. If you drink alcohol: Limit how much you have to: 0-1 drink a day for women. 0-2 drinks a day for men. Know how much alcohol is in your drink. In the [place] one drink equals one 12 oz bottle of beer (355 mL), one 5 oz glass of wine (148 mL), or one 1 oz glass of hard liquor (44 mL). Do not use any products that contain nicotine or tobacco. These products include cigarettes, chewing tobacco, and vaping devices, such as e-cigarettes. If you need help quitting, ask your health care provider. Activity  Follow a regular exercise program to stay fit. This will help you maintain your balance. Ask your health care provider what types of exercise are appropriate for you. If you need a cane or walker, use it as recommended by your health care provider. Wear supportive shoes that have nonskid soles. Safety  Remove any tripping hazards, such as rugs, cords, and clutter. Install safety equipment such as grab bars in bathrooms and safety rails on stairs. Keep rooms and walkways   well-lit. General instructions Talk with your health care provider about your risks for falling. Tell your health care provider if: You fall. Be sure to tell your health care provider about all falls, even ones that seem minor. You feel dizzy, tiredness (fatigue), or off-balance. Take over-the-counter and prescription medicines only as told by your health care provider. These include  supplements. Eat a healthy diet and maintain a healthy weight. A healthy diet includes low-fat dairy products, low-fat (lean) meats, and fiber from whole grains, beans, and lots of fruits and vegetables. Stay current with your vaccines. Schedule regular health, dental, and eye exams. Summary Having a healthy lifestyle and getting preventive care can help to protect your health and wellness after age 65. Screening and testing are the best way to find a health problem early and help you avoid having a fall. Early diagnosis and treatment give you the best chance for managing medical conditions that are more common for people who are older than age 65. Falls are a major cause of broken bones and head injuries in people who are older than age 65. Take precautions to prevent a fall at home. Work with your health care provider to learn what changes you can make to improve your health and wellness and to prevent falls. This information is not intended to replace advice given to you by your health care provider. Make sure you discuss any questions you have with your health care provider. Document Revised: 07/30/2020 Document Reviewed: 07/30/2020 Elsevier Patient Education  2023 [employer] Inc.  

## 2021-10-08 LAB — CMP14+EGFR
ALT: 19 IU/L (ref 0–32)
AST: 26 IU/L (ref 0–40)
Albumin/Globulin Ratio: 1.8 (ref 1.2–2.2)
Albumin: 4.3 g/dL (ref 3.9–4.9)
Alkaline Phosphatase: 133 IU/L — ABNORMAL HIGH (ref 44–121)
BUN/Creatinine Ratio: 13 (ref 12–28)
BUN: 15 mg/dL (ref 8–27)
Bilirubin Total: 0.4 mg/dL (ref 0.0–1.2)
CO2: 26 mmol/L (ref 20–29)
Calcium: 9.9 mg/dL (ref 8.7–10.3)
Chloride: 104 mmol/L (ref 96–106)
Creatinine, Ser: 1.12 mg/dL — ABNORMAL HIGH (ref 0.57–1.00)
Globulin, Total: 2.4 g/dL (ref 1.5–4.5)
Glucose: 103 mg/dL — ABNORMAL HIGH (ref 70–99)
Potassium: 5.1 mmol/L (ref 3.5–5.2)
Sodium: 142 mmol/L (ref 134–144)
Total Protein: 6.7 g/dL (ref 6.0–8.5)
eGFR: 54 mL/min/{1.73_m2} — ABNORMAL LOW (ref >59)

## 2021-10-08 LAB — CBC WITH DIFFERENTIAL/PLATELET
Basophils Absolute: 0.1 10*3/uL (ref 0.0–0.2)
Basos: 1 %
EOS (ABSOLUTE): 0.4 10*3/uL (ref 0.0–0.4)
Eos: 7 %
Hematocrit: 38.2 % (ref 34.0–46.6)
Hemoglobin: 12.2 g/dL (ref 11.1–15.9)
Immature Grans (Abs): 0 10*3/uL (ref 0.0–0.1)
Immature Granulocytes: 0 %
Lymphocytes Absolute: 2 10*3/uL (ref 0.7–3.1)
Lymphs: 35 %
MCH: 27.3 pg (ref 26.6–33.0)
MCHC: 31.9 g/dL (ref 31.5–35.7)
MCV: 86 fL (ref 79–97)
Monocytes Absolute: 0.6 10*3/uL (ref 0.1–0.9)
Monocytes: 10 %
Neutrophils Absolute: 2.7 10*3/uL (ref 1.4–7.0)
Neutrophils: 47 %
Platelets: 290 10*3/uL (ref 150–450)
RBC: 4.47 x10E6/uL (ref 3.77–5.28)
RDW: 15.2 % (ref 11.7–15.4)
WBC: 5.7 10*3/uL (ref 3.4–10.8)

## 2021-10-08 LAB — LIPID PANEL
Chol/HDL Ratio: 2.5 ratio (ref 0.0–4.4)
Cholesterol, Total: 164 mg/dL (ref 100–199)
HDL: 65 mg/dL (ref >39)
LDL Chol Calc (NIH): 85 mg/dL (ref 0–99)
Triglycerides: 73 mg/dL (ref 0–149)
VLDL Cholesterol Cal: 14 mg/dL (ref 5–40)

## 2021-10-08 LAB — TSH: TSH: 2.14 u[IU]/mL (ref 0.450–4.500)

## 2021-11-18 ENCOUNTER — Encounter: Payer: Self-pay | Admitting: Family Medicine

## 2021-11-18 ENCOUNTER — Ambulatory Visit (INDEPENDENT_AMBULATORY_CARE_PROVIDER_SITE_OTHER): Payer: Medicare Other | Admitting: Family Medicine

## 2021-11-18 VITALS — BP 140/80 | HR 66 | Temp 97.8°F | Ht 63.0 in | Wt 178.0 lb

## 2021-11-18 DIAGNOSIS — L301 Dyshidrosis [pompholyx]: Secondary | ICD-10-CM

## 2021-11-18 MED ORDER — BETAMETHASONE VALERATE 0.1 % EX OINT: 1.0000 | TOPICAL_OINTMENT | Freq: Two times a day (BID) | CUTANEOUS | 0 refills | Status: AC

## 2021-11-18 MED ORDER — MUPIROCIN 2 % EX OINT: 1.0000 | TOPICAL_OINTMENT | Freq: Two times a day (BID) | CUTANEOUS | 0 refills | Status: DC

## 2021-11-18 MED ORDER — PREDNISONE 20 MG PO TABS: 20.0000 mg | ORAL_TABLET | Freq: Every day | ORAL | 0 refills | Status: AC

## 2021-11-18 NOTE — Progress Notes (Signed)
   Acute Office Visit  Subjective:     Patient ID: Gina Murray, female    DOB: October 05, 1955, 66 y.o.   MRN: [id number]  Chief Complaint  Patient presents with   Rash    Rash   Patient is in today for a rash to her hand. It is present on her right 3rd finger. It is red and cracked. There is also some swelling, blistering, and clear drainage. 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 No fever or decreased ROM. She has tried hydrocortisone cream without improvement.   Review of Systems  Skin:  Positive for rash.   As per HPI.      Objective:    BP (!) 140/80 Comment: at home reading per pt  Pulse 66   Temp 97.8 F (36.6 C) (Temporal)   Ht '5\' 3"'$  (1.6 m)   Wt 178 lb (80.7 kg)   LMP 08/13/2001   SpO2 99%   BMI 31.53 kg/m    Physical Exam Vitals and nursing note reviewed.  Constitutional:      General: She is not in acute distress.    Appearance: She is not ill-appearing, toxic-appearing or diaphoretic.  Pulmonary:     Effort: Pulmonary effort is normal. No respiratory distress.  Skin:    General: Skin is warm and dry.     Findings: Rash (Erythematous, cracked, blistered skin with clear fluid to right 3rd finger) present.  Neurological:     General: No focal deficit present.     Mental Status: She is alert and oriented to person, place, and time.  Psychiatric:        Mood and Affect: Mood normal.        Behavior: Behavior normal.     No results found for any visits on 11/18/21.      Assessment & Plan:   Gina Murray was seen today for rash.  Diagnoses and all orders for this visit:  Dyshidrotic eczema Prednisone burst and betamethasone creamed ordered. Bactroban if symptoms worsen or do not improvement. Handout given. Discussed symptomatic care and return precautions.  -     predniSONE (DELTASONE) 20 MG tablet; Take 1 tablet (20 mg total) by mouth daily with breakfast for 5 days. -     betamethasone valerate ointment (VALISONE)  0.1 %; Apply 1 Application topically 2 (two) times daily. -     mupirocin ointment (BACTROBAN) 2 %; Apply 1 Application topically 2 (two) times daily.  The patient indicates understanding of these issues and agrees with the plan.  Gwenlyn Perking, FNP

## 2021-11-18 NOTE — Patient Instructions (Signed)
Dyshidrotic Eczema Dyshidrotic eczema, also known as pompholyx, is a type of eczema that causes very itchy, fluid-filled blisters (vesicles) to form on the hands and feet. It is more common before age 66, though it can affect people of any age. There is no cure, but treatment and certain lifestyle changes can help relieve symptoms. What are the causes? The cause of this condition is not known. What increases the risk? You are more likely to develop this condition if: You wash your hands frequently. You have a personal or family history of eczema, allergies, asthma, or hay fever. You are allergic to metals, such as nickel or cobalt. You work with cement. You smoke. What are the signs or symptoms? Symptoms of this condition may affect the hands, the feet, or both. Symptoms may come and go (recur), and may include: Severe itching. This may happen before blisters appear. Blisters. These may form suddenly. In the early stages, blisters may form near the fingertips. In severe cases, blisters may grow to large blister masses (bullae). Blisters resolve in 2-3 weeks without bursting. This is followed by a dry phase in which itching eases. Pain and swelling. Cracks or long, narrow openings (fissures) in the skin. Severe dryness. Ridges on the nails. How is this diagnosed? This condition may be diagnosed based on: Your symptoms and a physical exam. Your medical history. Skin scrapings to rule out a fungal infection. Testing a swab of fluid for bacteria (culture). Removing a small piece of skin (biopsy) to test for infection or to rule out other conditions. Skin patch tests. These tests involve using patches that contain possible allergens and placing them on your back. Your health care provider will wait a few days and then check to see if an allergic reaction occurred. These tests may be done if your health care provider suspects allergic reactions, or to rule out other types of eczema. You may  be referred to a health care provider who specializes in skin conditions (dermatologist) to help diagnose and treat this condition. How is this treated? There is no cure for this condition, but treatment can help relieve symptoms. Depending on the amount and severity of the blisters, your health care provider may suggest: Avoiding allergens, irritants, or triggers that worsen symptoms. This may involve lifestyle changes, such as: Using different lotions or soaps. Avoiding hot weather or places that will cause you to sweat a lot. Managing stress with coping techniques, such as relaxation and exercise, and asking for help when you need it. Diet changes as recommended by your health care provider. Using a clean, damp towel (cool compress) to relieve symptoms. Soaking in a bath that contains a type of salt that relieves irritation (aluminum acetate soaks). Medicines, such as: Medicine taken by mouth to reduce itching (oral antihistamines). Medicine applied to the skin to reduce swelling and irritation (topical corticosteroids). Medicine that reduces the activity of the body's disease-fighting system (immunosuppressants) to treat inflammation. This may be given in severe cases. Antibiotic medicines to treat bacterial infection. Light therapy (phototherapy). This involves shining ultraviolet (UV) light on the affected skin in order to reduce itchiness and inflammation. Follow these instructions at home: Bathing and skin care  Wash skin gently. After bathing or washing your hands, pat your skin dry. Avoid rubbing your skin. Remove all jewelry before bathing. If the skin under the jewelry stays wet, blisters may form or get worse. Apply cool compresses as told by your health care provider. To do this: Soak a clean towel in  cool water. Wring out excess water until towel is damp. Place the towel over the affected skin. Leave the towel on for 20 minutes at a time, 2-3 times a day. Use mild soaps,  cleansers, and lotions that do not contain dyes, perfumes, or other irritants. Keep your skin hydrated. To do this: Avoid very hot water. Take lukewarm baths or showers. Apply moisturizer within 3 minutes of bathing. This locks in moisture. Medicines Take and apply over-the-counter and prescription medicines only as told by your health care provider. If you were prescribed an antibiotic medicine, take or apply it as told by your health care provider. Do not stop using the antibiotic even if you start to feel better. General instructions Do not use any products that contain nicotine or tobacco. These include cigarettes, chewing tobacco, and vaping devices, such as e-cigarettes. If you need help quitting, ask your health care provider. Identify and avoid triggers and allergens. Keep fingernails short to avoid breaking the skin while scratching. Use waterproof gloves to protect your hands when doing work that keeps your hands wet for a long time. Wear socks to keep your feet dry. Keep all follow-up visits. This is important. Contact a health care provider if: You have symptoms that do not go away. You have signs of infection, such as: Crusting, pus, or a bad smell. More redness, swelling, or pain. Increased warmth in the affected area. Get help right away if: Your skin gets streaking redness with associated pain. Summary Dyshidrotic eczema, also known as pompholyx, is a type of eczema that causes very itchy, fluid-filled blisters (vesicles) to form on the hands and feet. The cause of this condition is not known. There is no cure for this condition, but treatment can help relieve symptoms. Treatment depends on the amount and severity of the blisters. Use mild soaps, cleansers, and lotions that do not contain dyes, perfumes, or other irritants. Keep your skin hydrated. This information is not intended to replace advice given to you by your health care provider. Make sure you discuss any  questions you have with your health care provider. Document Revised: 12/19/2019 Document Reviewed: 12/19/2019 Elsevier Patient Education  2023 [employer].  

## 2021-12-12 ENCOUNTER — Other Ambulatory Visit: Payer: Self-pay | Admitting: Family

## 2021-12-12 DIAGNOSIS — I10 Essential (primary) hypertension: Secondary | ICD-10-CM

## 2021-12-12 DIAGNOSIS — E785 Hyperlipidemia, unspecified: Secondary | ICD-10-CM

## 2022-01-15 ENCOUNTER — Ambulatory Visit (INDEPENDENT_AMBULATORY_CARE_PROVIDER_SITE_OTHER): Payer: Medicare Other

## 2022-01-15 DIAGNOSIS — Z23 Encounter for immunization: Secondary | ICD-10-CM | POA: Diagnosis not present

## 2022-04-10 ENCOUNTER — Ambulatory Visit (INDEPENDENT_AMBULATORY_CARE_PROVIDER_SITE_OTHER): Payer: Medicare Other | Admitting: Family

## 2022-04-10 ENCOUNTER — Encounter: Payer: Self-pay | Admitting: Family

## 2022-04-10 VITALS — BP 139/69 | HR 69 | Temp 97.7°F | Ht 63.0 in | Wt 176.8 lb

## 2022-04-10 DIAGNOSIS — K219 Gastro-esophageal reflux disease without esophagitis: Secondary | ICD-10-CM | POA: Diagnosis not present

## 2022-04-10 DIAGNOSIS — F320 Major depressive disorder, single episode, mild: Secondary | ICD-10-CM

## 2022-04-10 DIAGNOSIS — E785 Hyperlipidemia, unspecified: Secondary | ICD-10-CM

## 2022-04-10 DIAGNOSIS — I10 Essential (primary) hypertension: Secondary | ICD-10-CM | POA: Diagnosis not present

## 2022-04-10 DIAGNOSIS — E669 Obesity, unspecified: Secondary | ICD-10-CM | POA: Diagnosis not present

## 2022-04-10 LAB — CMP14+EGFR
ALT: 24 IU/L (ref 0–32)
AST: 26 IU/L (ref 0–40)
Albumin/Globulin Ratio: 1.7 (ref 1.2–2.2)
Albumin: 4.4 g/dL (ref 3.9–4.9)
Alkaline Phosphatase: 137 IU/L — ABNORMAL HIGH (ref 44–121)
BUN/Creatinine Ratio: 18 (ref 12–28)
BUN: 19 mg/dL (ref 8–27)
Bilirubin Total: 0.6 mg/dL (ref 0.0–1.2)
CO2: 24 mmol/L (ref 20–29)
Calcium: 10.2 mg/dL (ref 8.7–10.3)
Chloride: 102 mmol/L (ref 96–106)
Creatinine, Ser: 1.03 mg/dL — ABNORMAL HIGH (ref 0.57–1.00)
Globulin, Total: 2.6 g/dL (ref 1.5–4.5)
Glucose: 94 mg/dL (ref 70–99)
Potassium: 4.1 mmol/L (ref 3.5–5.2)
Sodium: 143 mmol/L (ref 134–144)
Total Protein: 7 g/dL (ref 6.0–8.5)
eGFR: 60 mL/min/{1.73_m2} (ref >59)

## 2022-04-10 MED ORDER — AMLODIPINE BESYLATE 10 MG PO TABS: 10.0000 mg | ORAL_TABLET | Freq: Every day | ORAL | 1 refills | Status: DC

## 2022-04-10 MED ORDER — METOPROLOL SUCCINATE ER 100 MG PO TB24: 100.0000 mg | ORAL_TABLET | Freq: Every day | ORAL | 1 refills | Status: DC

## 2022-04-10 MED ORDER — LISINOPRIL 20 MG PO TABS: 20.0000 mg | ORAL_TABLET | Freq: Two times a day (BID) | ORAL | 1 refills | Status: DC

## 2022-04-10 MED ORDER — METOPROLOL SUCCINATE ER 100 MG PO TB24: ORAL_TABLET | ORAL | 1 refills | Status: DC

## 2022-04-10 MED ORDER — ATORVASTATIN CALCIUM 80 MG PO TABS: 80.0000 mg | ORAL_TABLET | Freq: Every day | ORAL | 1 refills | Status: DC

## 2022-04-10 NOTE — Progress Notes (Signed)
Fax from [facility]. Resent RF on Metoprolol from today w/ complete directions.

## 2022-04-10 NOTE — Addendum Note (Signed)
Addended by: Antonietta Barcelona D on: 04/10/2022 11:49 AM   Modules accepted: Orders

## 2022-04-10 NOTE — Patient Instructions (Signed)
Health Maintenance After Age 67 After age 67, you are at a higher risk for certain long-term diseases and infections as well as injuries from falls. Falls are a major cause of broken bones and head injuries in people who are older than age 67. Getting regular preventive care can help to keep you healthy and well. Preventive care includes getting regular testing and making lifestyle changes as recommended by your health care provider. Talk with your health care provider about: Which screenings and tests you should have. A screening is a test that checks for a disease when you have no symptoms. A diet and exercise plan that is right for you. What should I know about screenings and tests to prevent falls? Screening and testing are the best ways to find a health problem early. Early diagnosis and treatment give you the best chance of managing medical conditions that are common after age 67. Certain conditions and lifestyle choices may make you more likely to have a fall. Your health care provider may recommend: Regular vision checks. Poor vision and conditions such as cataracts can make you more likely to have a fall. If you wear glasses, make sure to get your prescription updated if your vision changes. Medicine review. Work with your health care provider to regularly review all of the medicines you are taking, including over-the-counter medicines. Ask your health care provider about any side effects that may make you more likely to have a fall. Tell your health care provider if any medicines that you take make you feel dizzy or sleepy. Strength and balance checks. Your health care provider may recommend certain tests to check your strength and balance while standing, walking, or changing positions. Foot health exam. Foot pain and numbness, as well as not wearing proper footwear, can make you more likely to have a fall. Screenings, including: Osteoporosis screening. Osteoporosis is a condition that causes  the bones to get weaker and break more easily. Blood pressure screening. Blood pressure changes and medicines to control blood pressure can make you feel dizzy. Depression screening. You may be more likely to have a fall if you have a fear of falling, feel depressed, or feel unable to do activities that you used to do. Alcohol use screening. Using too much alcohol can affect your balance and may make you more likely to have a fall. Follow these instructions at home: Lifestyle Do not drink alcohol if: Your health care provider tells you not to drink. If you drink alcohol: Limit how much you have to: 0-1 drink a day for women. 0-2 drinks a day for men. Know how much alcohol is in your drink. In the [place] one drink equals one 12 oz bottle of beer (355 mL), one 5 oz glass of wine (148 mL), or one 1 oz glass of hard liquor (44 mL). Do not use any products that contain nicotine or tobacco. These products include cigarettes, chewing tobacco, and vaping devices, such as e-cigarettes. If you need help quitting, ask your health care provider. Activity  Follow a regular exercise program to stay fit. This will help you maintain your balance. Ask your health care provider what types of exercise are appropriate for you. If you need a cane or walker, use it as recommended by your health care provider. Wear supportive shoes that have nonskid soles. Safety  Remove any tripping hazards, such as rugs, cords, and clutter. Install safety equipment such as grab bars in bathrooms and safety rails on stairs. Keep rooms and walkways   well-lit. General instructions Talk with your health care provider about your risks for falling. Tell your health care provider if: You fall. Be sure to tell your health care provider about all falls, even ones that seem minor. You feel dizzy, tiredness (fatigue), or off-balance. Take over-the-counter and prescription medicines only as told by your health care provider. These include  supplements. Eat a healthy diet and maintain a healthy weight. A healthy diet includes low-fat dairy products, low-fat (lean) meats, and fiber from whole grains, beans, and lots of fruits and vegetables. Stay current with your vaccines. Schedule regular health, dental, and eye exams. Summary Having a healthy lifestyle and getting preventive care can help to protect your health and wellness after age 67. Screening and testing are the best way to find a health problem early and help you avoid having a fall. Early diagnosis and treatment give you the best chance for managing medical conditions that are more common for people who are older than age 67. Falls are a major cause of broken bones and head injuries in people who are older than age 67. Take precautions to prevent a fall at home. Work with your health care provider to learn what changes you can make to improve your health and wellness and to prevent falls. This information is not intended to replace advice given to you by your health care provider. Make sure you discuss any questions you have with your health care provider. Document Revised: 07/30/2020 Document Reviewed: 07/30/2020 Elsevier Patient Education  2023 [employer]vier Inc.  

## 2022-04-10 NOTE — Progress Notes (Signed)
Subjective:    Patient ID: Gina Murray, female    DOB: 1955/06/13, 67 y.o.   MRN: [id number]  Chief Complaint  Patient presents with   Medical Management of Chronic Issues    Fasting. No concerns    Pt presents to the office today for chronic follow up. She is followed by Gyn annually. Had her Dexa scan 08/05/19 that showed Osteopenia. She is taking Calcium and Vit D.   She retired 09/20/21.  Hypertension This is a chronic problem. The current episode started more than 1 year ago. The problem has been resolved since onset. The problem is controlled. Pertinent negatives include no malaise/fatigue, peripheral edema or shortness of breath. Risk factors for coronary artery disease include dyslipidemia and obesity. The current treatment provides moderate improvement.  Gastroesophageal Reflux She complains of belching and heartburn. This is a chronic problem. The current episode started more than 1 year ago. The problem occurs occasionally. She has tried a PPI for the symptoms. The treatment provided moderate relief.  Hyperlipidemia This is a chronic problem. The current episode started more than 1 year ago. The problem is controlled. Recent lipid tests were reviewed and are normal. Exacerbating diseases include obesity. Pertinent negatives include no shortness of breath. Current antihyperlipidemic treatment includes statins. The current treatment provides moderate improvement of lipids. Risk factors for coronary artery disease include dyslipidemia, hypertension, a sedentary lifestyle and post-menopausal.  Depression        This is a chronic problem.  The current episode started more than 1 year ago.   The onset quality is gradual.   The problem occurs intermittently.  Associated symptoms include no helplessness, no hopelessness and not sad.  Past treatments include nothing.     Review of Systems  Constitutional:  Negative for malaise/fatigue.  Respiratory:  Negative for shortness of  breath.   Gastrointestinal:  Positive for heartburn.  Psychiatric/Behavioral:  Positive for depression.   All other systems reviewed and are negative.      Objective:   Physical Exam Vitals reviewed.  Constitutional:      General: She is not in acute distress.    Appearance: She is well-developed. She is obese.  HENT:     Head: Normocephalic and atraumatic.     Right Ear: Tympanic membrane normal.     Left Ear: Tympanic membrane normal.  Eyes:     Pupils: Pupils are equal, round, and reactive to light.  Neck:     Thyroid: No thyromegaly.  Cardiovascular:     Rate and Rhythm: Normal rate and regular rhythm.     Heart sounds: Normal heart sounds. No murmur heard. Pulmonary:     Effort: Pulmonary effort is normal. No respiratory distress.     Breath sounds: Normal breath sounds. No wheezing.  Abdominal:     General: Bowel sounds are normal. There is no distension.     Palpations: Abdomen is soft.     Tenderness: There is no abdominal tenderness.  Musculoskeletal:        General: No tenderness. Normal range of motion.     Cervical back: Normal range of motion and neck supple.  Skin:    General: Skin is warm and dry.  Neurological:     Mental Status: She is alert and oriented to person, place, and time.     Cranial Nerves: No cranial nerve deficit.     Deep Tendon Reflexes: Reflexes are normal and symmetric.  Psychiatric:        Behavior: Behavior  normal.        Thought Content: Thought content normal.        Judgment: Judgment normal.       BP (!) 152/82   Pulse 69   Temp 97.7 F (36.5 C) (Temporal)   Ht '5\' 3"'$  (1.6 m)   Wt 176 lb 12.8 oz (80.2 kg)   LMP 08/13/2001   SpO2 99%   BMI 31.32 kg/m      Assessment & Plan:  Gina Murray comes in today with chief complaint of Medical Management of Chronic Issues (Fasting. No concerns )   Diagnosis and orders addressed:  1. Essential hypertension - amLODipine (NORVASC) 10 MG tablet; Take 1 tablet (10 mg total)  by mouth daily.  Dispense: 90 tablet; Refill: 1 - lisinopril (ZESTRIL) 20 MG tablet; Take 1 tablet (20 mg total) by mouth 2 (two) times daily.  Dispense: 180 tablet; Refill: 1 - metoprolol succinate (TOPROL-XL) 100 MG 24 hr tablet; Take with or immediately following a meal.  Dispense: 90 tablet; Refill: 1 - CMP14+EGFR  2. Hyperlipidemia, unspecified hyperlipidemia type - atorvastatin (LIPITOR) 80 MG tablet; Take 1 tablet (80 mg total) by mouth daily.  Dispense: 90 tablet; Refill: 1 - CMP14+EGFR  3. Obesity (BMI 30-39.9) - CMP14+EGFR  4. Primary hypertension - CMP14+EGFR  5. Gastroesophageal reflux disease, unspecified whether esophagitis present - CMP14+EGFR  6. Depression, major, single episode, mild ([place]) - CMP14+EGFR   Labs pending Health Maintenance reviewed Diet and exercise encouraged  Follow up plan: 6 months    Evelina Dun, FNP

## 2022-05-12 ENCOUNTER — Ambulatory Visit (INDEPENDENT_AMBULATORY_CARE_PROVIDER_SITE_OTHER): Payer: Medicare Other

## 2022-05-12 VITALS — Ht 63.0 in | Wt 180.0 lb

## 2022-05-12 DIAGNOSIS — Z0000 Encounter for general adult medical examination without abnormal findings: Secondary | ICD-10-CM | POA: Diagnosis not present

## 2022-05-12 NOTE — Patient Instructions (Signed)
Gina Murray , Thank you for taking time to come for your Medicare Wellness Visit. I appreciate your ongoing commitment to your health goals. Please review the following plan we discussed and let me know if I can assist you in the future.   These are the goals we discussed:  Goals      Exercise 3x per week (30 min per time)        This is a list of the screening recommended for you and due dates:  Health Maintenance  Topic Date Due   COVID-19 Vaccine (4 - 2023-24 season) 11/22/2021   Mammogram  09/03/2022   Medicare Annual Wellness Visit  05/13/2023   DTaP/Tdap/Td vaccine (2 - Td or Tdap) 01/08/2024   Colon Cancer Screening  08/28/2026   Pneumonia Vaccine  Completed   Flu Shot  Completed   DEXA scan (bone density measurement)  Completed   Hepatitis C Screening: USPSTF Recommendation to screen - Ages 35-79 yo.  Completed   Zoster (Shingles) Vaccine  Completed   HPV Vaccine  Aged Out    Advanced directives: Advance directive discussed with you today. I have provided a copy for you to complete at home and have notarized. Once this is complete please bring a copy in to our office so we can scan it into your chart.   Conditions/risks identified: .Aim for 30 minutes of exercise or brisk walking, 6-8 glasses of water, and 5 servings of fruits and vegetables each day.   Next appointment: Follow up in one year for your annual wellness visit    Preventive Care 65 Years and Older, Female Preventive care refers to lifestyle choices and visits with your health care provider that can promote health and wellness. What does preventive care include? A yearly physical exam. This is also called an annual well check. Dental exams once or twice a year. Routine eye exams. Ask your health care provider how often you should have your eyes checked. Personal lifestyle choices, including: Daily care of your teeth and gums. Regular physical activity. Eating a healthy diet. Avoiding tobacco and drug  use. Limiting alcohol use. Practicing safe sex. Taking low-dose aspirin every day. Taking vitamin and mineral supplements as recommended by your health care provider. What happens during an annual well check? The services and screenings done by your health care provider during your annual well check will depend on your age, overall health, lifestyle risk factors, and family history of disease. Counseling  Your health care provider may ask you questions about your: Alcohol use. Tobacco use. Drug use. Emotional well-being. Home and relationship well-being. Sexual activity. Eating habits. History of falls. Memory and ability to understand (cognition). Work and work Statistician. Reproductive health. Screening  You may have the following tests or measurements: Height, weight, and BMI. Blood pressure. Lipid and cholesterol levels. These may be checked every 5 years, or more frequently if you are over 44 years old. Skin check. Lung cancer screening. You may have this screening every year starting at age 56 if you have a 30-pack-year history of smoking and currently smoke or have quit within the past 15 years. Fecal occult blood test (FOBT) of the stool. You may have this test every year starting at age 14. Flexible sigmoidoscopy or colonoscopy. You may have a sigmoidoscopy every 5 years or a colonoscopy every 10 years starting at age 66. Hepatitis C blood test. Hepatitis B blood test. Sexually transmitted disease (STD) testing. Diabetes screening. This is done by checking your blood sugar (glucose)  after you have not eaten for a while (fasting). You may have this done every 1-3 years. Bone density scan. This is done to screen for osteoporosis. You may have this done starting at age 40. Mammogram. This may be done every 1-2 years. Talk to your health care provider about how often you should have regular mammograms. Talk with your health care provider about your test results, treatment  options, and if necessary, the need for more tests. Vaccines  Your health care provider may recommend certain vaccines, such as: Influenza vaccine. This is recommended every year. Tetanus, diphtheria, and acellular pertussis (Tdap, Td) vaccine. You may need a Td booster every 10 years. Zoster vaccine. You may need this after age 68. Pneumococcal 13-valent conjugate (PCV13) vaccine. One dose is recommended after age 84. Pneumococcal polysaccharide (PPSV23) vaccine. One dose is recommended after age 54. Talk to your health care provider about which screenings and vaccines you need and how often you need them. This information is not intended to replace advice given to you by your health care provider. Make sure you discuss any questions you have with your health care provider. Document Released: 04/06/2015 Document Revised: 11/28/2015 Document Reviewed: 01/09/2015 Elsevier Interactive Patient Education  2017 [place] Prevention in the Home Falls can cause injuries. They can happen to people of all ages. There are many things you can do to make your home safe and to help prevent falls. What can I do on the outside of my home? Regularly fix the edges of walkways and driveways and fix any cracks. Remove anything that might make you trip as you walk through a door, such as a raised step or threshold. Trim any bushes or trees on the path to your home. Use bright outdoor lighting. Clear any walking paths of anything that might make someone trip, such as rocks or tools. Regularly check to see if handrails are loose or broken. Make sure that both sides of any steps have handrails. Any raised decks and porches should have guardrails on the edges. Have any leaves, snow, or ice cleared regularly. Use sand or salt on walking paths during winter. Clean up any spills in your garage right away. This includes oil or grease spills. What can I do in the bathroom? Use night lights. Install grab  bars by the toilet and in the tub and shower. Do not use towel bars as grab bars. Use non-skid mats or decals in the tub or shower. If you need to sit down in the shower, use a plastic, non-slip stool. Keep the floor dry. Clean up any water that spills on the floor as soon as it happens. Remove soap buildup in the tub or shower regularly. Attach bath mats securely with double-sided non-slip rug tape. Do not have throw rugs and other things on the floor that can make you trip. What can I do in the bedroom? Use night lights. Make sure that you have a light by your bed that is easy to reach. Do not use any sheets or blankets that are too big for your bed. They should not hang down onto the floor. Have a firm chair that has side arms. You can use this for support while you get dressed. Do not have throw rugs and other things on the floor that can make you trip. What can I do in the kitchen? Clean up any spills right away. Avoid walking on wet floors. Keep items that you use a lot in easy-to-reach places. If  you need to reach something above you, use a strong step stool that has a grab bar. Keep electrical cords out of the way. Do not use floor polish or wax that makes floors slippery. If you must use wax, use non-skid floor wax. Do not have throw rugs and other things on the floor that can make you trip. What can I do with my stairs? Do not leave any items on the stairs. Make sure that there are handrails on both sides of the stairs and use them. Fix handrails that are broken or loose. Make sure that handrails are as long as the stairways. Check any carpeting to make sure that it is firmly attached to the stairs. Fix any carpet that is loose or worn. Avoid having throw rugs at the top or bottom of the stairs. If you do have throw rugs, attach them to the floor with carpet tape. Make sure that you have a light switch at the top of the stairs and the bottom of the stairs. If you do not have them,  ask someone to add them for you. What else can I do to help prevent falls? Wear shoes that: Do not have high heels. Have rubber bottoms. Are comfortable and fit you well. Are closed at the toe. Do not wear sandals. If you use a stepladder: Make sure that it is fully opened. Do not climb a closed stepladder. Make sure that both sides of the stepladder are locked into place. Ask someone to hold it for you, if possible. Clearly mark and make sure that you can see: Any grab bars or handrails. First and last steps. Where the edge of each step is. Use tools that help you move around (mobility aids) if they are needed. These include: Canes. Walkers. Scooters. Crutches. Turn on the lights when you go into a dark area. Replace any light bulbs as soon as they burn out. Set up your furniture so you have a clear path. Avoid moving your furniture around. If any of your floors are uneven, fix them. If there are any pets around you, be aware of where they are. Review your medicines with your doctor. Some medicines can make you feel dizzy. This can increase your chance of falling. Ask your doctor what other things that you can do to help prevent falls. This information is not intended to replace advice given to you by your health care provider. Make sure you discuss any questions you have with your health care provider. Document Released: 01/04/2009 Document Revised: 08/16/2015 Document Reviewed: 04/14/2014 Elsevier Interactive Patient Education  2017 [employer].

## 2022-05-12 NOTE — Progress Notes (Signed)
Subjective:   Gina Murray is a 67 y.o. female who presents for Medicare Annual (Subsequent) preventive examination. I connected with  [name] on 05/12/22 by a audio enabled telemedicine application and verified that I am speaking with the correct person using two identifiers.  Patient Location: Home  Provider Location: Home Office  I discussed the limitations of evaluation and management by telemedicine. The patient expressed understanding and agreed to proceed.  Review of Systems     Cardiac Risk Factors include: advanced age (>76mn, >>5women);hypertension     Objective:    Today's Vitals   05/12/22 0847  Weight: 180 lb (81.6 kg)  Height: 5' 3"$  (1.6 m)   Body mass index is 31.89 kg/m.     05/12/2022    8:51 AM 08/27/2016    7:25 AM 01/30/2014    9:37 AM 01/25/2014    3:01 PM  Advanced Directives  Does Patient Have a Medical Advance Directive? No No No No  Would patient like information on creating a medical advance directive? No - Patient declined No - Patient declined No - patient declined information No - patient declined information    Current Medications (verified) Outpatient Encounter Medications as of 05/12/2022  Medication Sig   amLODipine (NORVASC) 10 MG tablet Take 1 tablet (10 mg total) by mouth daily.   aspirin EC 81 MG tablet Take 81 mg by mouth daily.   atorvastatin (LIPITOR) 80 MG tablet Take 1 tablet (80 mg total) by mouth daily.   betamethasone valerate ointment (VALISONE) 0.1 % Apply 1 Application topically 2 (two) times daily.   Calcium Carbonate-Vitamin D (CALCIUM 600+D PO) Take 2 tablets by mouth daily.   cetirizine (ZYRTEC) 10 MG tablet Take 10 mg by mouth daily.   Cholecalciferol (VITAMIN D) 2000 UNITS tablet Take 2,000 Units by mouth daily.   Hypromellose (ARTIFICIAL TEARS OP) Place 1-2 drops into both eyes 2 (two) times daily as needed (dry eyes).   lisinopril (ZESTRIL) 20 MG tablet Take 1 tablet (20 mg total) by mouth 2 (two) times  daily.   metoprolol succinate (TOPROL-XL) 100 MG 24 hr tablet Take 1 tablet (100 mg total) by mouth daily. Take with or immediately following a meal.   omeprazole (PRILOSEC) 20 MG capsule Take 1 capsule (20 mg total) by mouth daily.   No facility-administered encounter medications on file as of 05/12/2022.    Allergies (verified) Latex, Hctz [hydrochlorothiazide], Penicillins, and Sulfa antibiotics   History: Past Medical History:  Diagnosis Date   GERD (gastroesophageal reflux disease) 04/22/2013   Hyperlipidemia    Hypertension    Multiple skin nodules    Vitamin D deficiency    Past Surgical History:  Procedure Laterality Date   CATARACT EXTRACTION W/PHACO Right 01/30/2014   Procedure: CATARACT EXTRACTION PHACO AND INTRAOCULAR LENS PLACEMENT ([place];  Surgeon: KTonny Branch MD;  Location: [facility];  Service: Ophthalmology;  Laterality: Right;  CDE 13.53   CESAREAN SECTION     COLONOSCOPY N/A 08/27/2016   Procedure: COLONOSCOPY;  Surgeon: RRogene Houston MD;  Location: [facility];  Service: Endoscopy;  Laterality: N/A;  863  Family History  Problem Relation Age of Onset   Hypertension Mother    Heart disease Mother 510  Atrial fibrillation Mother    Kidney disease Father        Dialysis   Lung cancer Sister    Social History   Socioeconomic History   Marital status: Married    Spouse name:  Not on file   Number of children: 1   Years of education: Not on file   Highest education level: Not on file  Occupational History   Not on file  Tobacco Use   Smoking status: Never   Smokeless tobacco: Never  Vaping Use   Vaping Use: Never used  Substance and Sexual Activity   Alcohol use: No   Drug use: No   Sexual activity: Not on file  Other Topics Concern   Not on file  Social History Narrative   Not on file   Social Determinants of Health   Financial Resource Strain: Low Risk  (05/12/2022)   Overall Financial Resource Strain (CARDIA)    Difficulty of Paying Living  Expenses: Not hard at all  Food Insecurity: No Food Insecurity (05/12/2022)   Hunger Vital Sign    Worried About Running Out of Food in the Last Year: Never true    Ran Out of Food in the Last Year: Never true  Transportation Needs: No Transportation Needs (05/12/2022)   PRAPARE - Hydrologist (Medical): No    Lack of Transportation (Non-Medical): No  Physical Activity: Insufficiently Active (05/12/2022)   Exercise Vital Sign    Days of Exercise per Week: 3 days    Minutes of Exercise per Session: 30 min  Stress: No Stress Concern Present (05/12/2022)   [place]    Feeling of Stress : Not at all  Social Connections: Moderately Integrated (05/12/2022)   Social Connection and Isolation Panel [NHANES]    Frequency of Communication with Friends and Family: More than three times a week    Frequency of Social Gatherings with Friends and Family: More than three times a week    Attends Religious Services: More than 4 times per year    Active Member of [employer] or Organizations: No    Attends Music therapist: Never    Marital Status: Married    Tobacco Counseling Counseling given: Not Answered   Clinical Intake:  Pre-visit preparation completed: Yes  Pain : No/denies pain     Nutritional Risks: None Diabetes: No  How often do you need to have someone help you when you read instructions, pamphlets, or other written materials from your doctor or pharmacy?: 1 - Never  Diabetic?no   Interpreter Needed?: No  Information entered by :: Jadene Pierini, LPN   Activities of Daily Living    05/12/2022    8:51 AM  In your present state of health, do you have any difficulty performing the following activities:  Hearing? 0  Vision? 0  Difficulty concentrating or making decisions? 0  Walking or climbing stairs? 0  Dressing or bathing? 0  Doing errands, shopping? 0  Preparing Food  and eating ? N  Using the Toilet? N  In the past six months, have you accidently leaked urine? N  Do you have problems with loss of bowel control? N  Managing your Medications? N  Managing your Finances? N  Housekeeping or managing your Housekeeping? N    Patient Care Team: Sharion Balloon, FNP as PCP - General (Nurse Practitioner)  Indicate any recent Medical Services you may have received from other than [facility] providers in the past year (date may be approximate).     Assessment:   This is a routine wellness examination for Gina Murray.  Hearing/Vision screen Vision Screening - Comments:: Wears rx glasses - up to date with routine eye  exams with  Dr.Shipiro   Dietary issues and exercise activities discussed: Current Exercise Habits: Home exercise routine, Type of exercise: walking, Time (Minutes): 30, Frequency (Times/Week): 3, Weekly Exercise (Minutes/Week): 90, Exercise limited by: None identified   Goals Addressed             This Visit's Progress    Exercise 3x per week (30 min per time)         Depression Screen    05/12/2022    8:50 AM 04/10/2022    8:15 AM 11/18/2021    3:16 PM 10/07/2021    8:41 AM 10/07/2021    8:18 AM 08/02/2021    8:12 AM 04/08/2021    8:31 AM  PHQ 2/9 Scores  PHQ - 2 Score 0 0 1 0 0 0 2  PHQ- 9 Score 0 0 4 1   3    $ Fall Risk    05/12/2022    8:48 AM 04/10/2022    8:15 AM 11/18/2021    3:16 PM 10/07/2021    8:18 AM 10/04/2020    8:39 AM  Fall Risk   Falls in the past year? 0 0 0 0 0  Number falls in past yr: 0      Injury with Fall? 0      Risk for fall due to : No Fall Risks      Follow up Falls prevention discussed        [place]:  Any stairs in or around the home? Yes  If so, are there any without handrails? No  Home free of loose throw rugs in walkways, pet beds, electrical cords, etc? Yes  Adequate lighting in your home to reduce risk of falls? Yes   ASSISTIVE DEVICES UTILIZED TO PREVENT  FALLS:  Life alert? No  Use of a cane, walker or w/c? No  Grab bars in the bathroom? Yes  Shower chair or bench in shower? Yes  Elevated toilet seat or a handicapped toilet? Yes          05/12/2022    8:51 AM  6CIT Screen  What Year? 0 points  What month? 0 points  What time? 0 points  Count back from 20 0 points  Months in reverse 0 points  Repeat phrase 0 points  Total Score 0 points    Immunizations Immunization History  Administered Date(s) Administered   Fluad Quad(high Dose 65+) 01/03/2021, 01/15/2022   Influenza,inj,Quad PF,6+ Mos 12/16/2012, 01/07/2014, 01/04/2015, 12/22/2015, 01/06/2017, 12/18/2017, 12/31/2018, 01/18/2020   Moderna SARS-COV2 Booster Vaccination 03/07/2020, 10/12/2020   PFIZER(Purple Top)SARS-COV-2 Vaccination 01/08/2021   Pneumococcal Conjugate-13 10/04/2020   Pneumococcal Polysaccharide-23 10/07/2021   Pneumococcal-Unspecified 10/04/2020   Tdap 01/07/2014   Unspecified SARS-COV-2 Vaccination 05/05/2019, 06/03/2019, 03/07/2020, 10/12/2020, 01/08/2021   Zoster Recombinat (Shingrix) 04/24/2017, 06/19/2017   Zoster, Live 04/21/2011    TDAP status: Up to date  Flu Vaccine status: Up to date  Pneumococcal vaccine status: Up to date  Covid-19 vaccine status: Completed vaccines  Qualifies for Shingles Vaccine? Yes   Zostavax completed No   Shingrix Completed?: No.    Education has been provided regarding the importance of this vaccine. Patient has been advised to call insurance company to determine out of pocket expense if they have not yet received this vaccine. Advised may also receive vaccine at local pharmacy or Health Dept. Verbalized acceptance and understanding.  Screening Tests Health Maintenance  Topic Date Due   COVID-19 Vaccine (4 - 2023-24 season) 11/22/2021  MAMMOGRAM  09/03/2022   Medicare Annual Wellness (AWV)  05/13/2023   DTaP/Tdap/Td (2 - Td or Tdap) 01/08/2024   COLONOSCOPY (Pts 45-44yr Insurance coverage will need to be  confirmed)  08/28/2026   Pneumonia Vaccine 67 Years old  Completed   INFLUENZA VACCINE  Completed   DEXA SCAN  Completed   Hepatitis C Screening  Completed   Zoster Vaccines- Shingrix  Completed   HPV VACCINES  Aged Out    Health Maintenance  Health Maintenance Due  Topic Date Due   COVID-19 Vaccine (4 - 2023-24 season) 11/22/2021    Colorectal cancer screening: Type of screening: Colonoscopy. Completed 08/27/2016. Repeat every 10 years  Mammogram status: Completed 09/02/2021. Repeat every year  Bone Density status: Completed 08/05/2019. Results reflect: Bone density results: OSTEOPENIA. Repeat every 5 years.  Lung Cancer Screening: (Low Dose CT Chest recommended if Age 67-80years, 30 pack-year currently smoking OR have quit w/in 15years.) does not qualify.   Lung Cancer Screening Referral: n/a  Additional Screening:  Hepatitis C Screening: does not qualify; Completed 04/19/2015  Vision Screening: Recommended annual ophthalmology exams for early detection of glaucoma and other disorders of the eye. Is the patient up to date with their annual eye exam?  Yes  Who is the provider or what is the name of the office in which the patient attends annual eye exams? Dr.Shipiro  If pt is not established with a provider, would they like to be referred to a provider to establish care? No .   Dental Screening: Recommended annual dental exams for proper oral hygiene  Community Resource Referral / Chronic Care Management: CRR required this visit?  No   CCM required this visit?  No      Plan:     I have personally reviewed and noted the following in the patient's chart:   Medical and social history Use of alcohol, tobacco or illicit drugs  Current medications and supplements including opioid prescriptions. Patient is not currently taking opioid prescriptions. Functional ability and status Nutritional status Physical activity Advanced directives List of other  physicians Hospitalizations, surgeries, and ER visits in previous 12 months Vitals Screenings to include cognitive, depression, and falls Referrals and appointments  In addition, I have reviewed and discussed with patient certain preventive protocols, quality metrics, and best practice recommendations. A written personalized care plan for preventive services as well as general preventive health recommendations were provided to patient.     LDaphane Shepherd LPN   [id number]  Nurse Notes: Will Have DEXA scan faxed to PCP

## 2022-06-10 ENCOUNTER — Encounter: Payer: Self-pay | Admitting: Family

## 2022-08-20 ENCOUNTER — Ambulatory Visit (INDEPENDENT_AMBULATORY_CARE_PROVIDER_SITE_OTHER): Payer: Medicare Other | Admitting: Family Medicine

## 2022-08-20 ENCOUNTER — Encounter: Payer: Self-pay | Admitting: Family Medicine

## 2022-08-20 VITALS — BP 138/69 | HR 65 | Temp 97.6°F | Ht 63.0 in | Wt 176.4 lb

## 2022-08-20 DIAGNOSIS — M25562 Pain in left knee: Secondary | ICD-10-CM

## 2022-08-20 NOTE — Patient Instructions (Signed)
Acute Knee Pain, Adult Acute knee pain is sudden and may be caused by damage, swelling, or irritation of the muscles and tissues that support the knee. Pain may result from: A fall. An injury to the knee from twisting motions. A hit to the knee. Infection. Acute knee pain may go away on its own with time and rest. If it does not, your health care provider may order tests to find the cause of the pain. These may include: Imaging tests, such as an X-ray, MRI, CT scan, or ultrasound. Joint aspiration. In this test, fluid is removed from the knee and evaluated. Arthroscopy. In this test, a lighted tube is inserted into the knee and an image is projected onto a TV screen. Biopsy. In this test, a sample of tissue is removed from the body and studied under a microscope. Follow these instructions at home: If you have a knee sleeve or brace:  Wear the knee sleeve or brace as told by your health care provider. Remove it only as told by your health care provider. Loosen it if your toes tingle, become numb, or turn cold and blue. Keep it clean. If the knee sleeve or brace is not waterproof: Do not let it get wet. Cover it with a watertight covering when you take a bath or shower. Activity Rest your knee. Do not do things that cause pain or make pain worse. Avoid high-impact activities or exercises, such as running, jumping rope, or doing jumping jacks. Work with a physical therapist to make a safe exercise program, as recommended by your health care provider. Do exercises as told by your physical therapist. Managing pain, stiffness, and swelling  If directed, put ice on the affected knee. To do this: If you have a removable knee sleeve or brace, remove it as told by your health care provider. Put ice in a plastic bag. Place a towel between your skin and the bag. Leave the ice on for 20 minutes, 2-3 times a day. Remove the ice if your skin turns bright red. This is very important. If you cannot  feel pain, heat, or cold, you have a greater risk of damage to the area. If directed, use an elastic bandage to put pressure (compression) on your injured knee. This may control swelling, give support, and help with discomfort. Raise (elevate) your knee above the level of your heart while you are sitting or lying down. Sleep with a pillow under your knee. General instructions Take over-the-counter and prescription medicines only as told by your health care provider. Do not use any products that contain nicotine or tobacco, such as cigarettes, e-cigarettes, and chewing tobacco. If you need help quitting, ask your health care provider. If you are overweight, work with your health care provider and a dietitian to set a weight-loss goal that is healthy and reasonable for you. Extra weight can put pressure on your knee. Pay attention to any changes in your symptoms. Keep all follow-up visits. This is important. Contact a health care provider if: Your knee pain continues, changes, or gets worse. You have a fever along with knee pain. Your knee feels warm to the touch or is red. Your knee buckles or locks up. Get help right away if: Your knee swells, and the swelling becomes worse. You cannot move your knee. You have severe pain in your knee that cannot be managed with pain medicine. Summary Acute knee pain can be caused by a fall, an injury, an infection, or damage, swelling, or irritation  of the tissues that support your knee. Your health care provider may perform tests to find out the cause of the pain. Pay attention to any changes in your symptoms. Relieve your pain with rest, medicines, light activity, and the use of ice. Get help right away if your knee swells, you cannot move your knee, or you have severe pain that cannot be managed with medicine. This information is not intended to replace advice given to you by your health care provider. Make sure you discuss any questions you have with  your health care provider. Document Revised: 08/23/2019 Document Reviewed: 08/24/2019 Elsevier Patient Education  2024 [employer].

## 2022-08-20 NOTE — Progress Notes (Signed)
   Acute Office Visit  Subjective:     Patient ID: JNIAH FISHBURNE, female    DOB: 09-14-1955, 67 y.o.   MRN: [id number]  Chief Complaint  Patient presents with   Knee Pain    Knee Pain  There was no injury mechanism. Pain location: posterior knee. The quality of the pain is described as aching and cramping (tightness). The pain has been Improving since onset. Pertinent negatives include no inability to bear weight, loss of motion, loss of sensation, numbness or tingling. She reports no foreign bodies present. The symptoms are aggravated by movement and weight bearing. She has tried rest, elevation and acetaminophen for the symptoms. The treatment provided mild relief.    ROS As per HPI.      Objective:    BP 138/69   Pulse 65   Temp 97.6 F (36.4 C) (Temporal)   Ht 5\' 3"  (1.6 m)   Wt 176 lb 6 oz (80 kg)   LMP 08/13/2001   SpO2 98%   BMI 31.24 kg/m    Physical Exam Vitals and nursing note reviewed.  Constitutional:      General: She is not in acute distress.    Appearance: Normal appearance. She is not ill-appearing, toxic-appearing or diaphoretic.  Pulmonary:     Effort: Pulmonary effort is normal. No respiratory distress.  Musculoskeletal:     Left knee: Crepitus present. No swelling, deformity, effusion, erythema or bony tenderness. Normal range of motion. No tenderness. Normal alignment, normal meniscus and normal patellar mobility.     Instability Tests: Medial McMurray test negative and lateral McMurray test negative.     Right lower leg: No edema.     Left lower leg: No tenderness or bony tenderness. No edema.  Skin:    General: Skin is warm and dry.  Neurological:     General: No focal deficit present.     Mental Status: She is alert and oriented to person, place, and time.  Psychiatric:        Mood and Affect: Mood normal.        Behavior: Behavior normal.     No results found for any visits on 08/20/22.      Assessment & Plan:   Claudetta was seen  today for knee pain.  Diagnoses and all orders for this visit:  Acute pain of left knee Mild crepitus on exam, otherwise benign. No injury. Discussed NSAIDs, RICE therapy. Will obtain xray if symptoms do not continue to improve over the next 2 weeks, or worsen.  Return if symptoms worsen or fail to improve.  The patient indicates understanding of these issues and agrees with the plan.  Gabriel Earing, FNP

## 2022-09-05 DIAGNOSIS — Z1231 Encounter for screening mammogram for malignant neoplasm of breast: Secondary | ICD-10-CM | POA: Diagnosis not present

## 2022-09-19 ENCOUNTER — Encounter: Payer: Self-pay | Admitting: Family

## 2022-10-09 ENCOUNTER — Ambulatory Visit (INDEPENDENT_AMBULATORY_CARE_PROVIDER_SITE_OTHER): Payer: Medicare Other

## 2022-10-09 ENCOUNTER — Encounter: Payer: Self-pay | Admitting: Family

## 2022-10-09 ENCOUNTER — Ambulatory Visit (INDEPENDENT_AMBULATORY_CARE_PROVIDER_SITE_OTHER): Payer: Medicare Other | Admitting: Family

## 2022-10-09 VITALS — BP 107/57 | HR 54 | Temp 97.5°F | Ht 63.0 in | Wt 178.4 lb

## 2022-10-09 DIAGNOSIS — L0291 Cutaneous abscess, unspecified: Secondary | ICD-10-CM | POA: Diagnosis not present

## 2022-10-09 DIAGNOSIS — M1712 Unilateral primary osteoarthritis, left knee: Secondary | ICD-10-CM | POA: Diagnosis not present

## 2022-10-09 DIAGNOSIS — R5383 Other fatigue: Secondary | ICD-10-CM | POA: Diagnosis not present

## 2022-10-09 DIAGNOSIS — I10 Essential (primary) hypertension: Secondary | ICD-10-CM

## 2022-10-09 DIAGNOSIS — M25562 Pain in left knee: Secondary | ICD-10-CM

## 2022-10-09 DIAGNOSIS — K219 Gastro-esophageal reflux disease without esophagitis: Secondary | ICD-10-CM

## 2022-10-09 DIAGNOSIS — Z0001 Encounter for general adult medical examination with abnormal findings: Secondary | ICD-10-CM | POA: Diagnosis not present

## 2022-10-09 DIAGNOSIS — F320 Major depressive disorder, single episode, mild: Secondary | ICD-10-CM

## 2022-10-09 DIAGNOSIS — R6889 Other general symptoms and signs: Secondary | ICD-10-CM | POA: Diagnosis not present

## 2022-10-09 DIAGNOSIS — E785 Hyperlipidemia, unspecified: Secondary | ICD-10-CM

## 2022-10-09 DIAGNOSIS — M25462 Effusion, left knee: Secondary | ICD-10-CM | POA: Diagnosis not present

## 2022-10-09 DIAGNOSIS — Z0000 Encounter for general adult medical examination without abnormal findings: Secondary | ICD-10-CM

## 2022-10-09 DIAGNOSIS — E669 Obesity, unspecified: Secondary | ICD-10-CM

## 2022-10-09 MED ORDER — DOXYCYCLINE HYCLATE 100 MG PO TABS
100.0000 mg | ORAL_TABLET | Freq: Two times a day (BID) | ORAL | 0 refills | Status: DC
Start: 2022-10-09 — End: 2023-05-20

## 2022-10-09 MED ORDER — DICLOFENAC SODIUM 75 MG PO TBEC
75.0000 mg | DELAYED_RELEASE_TABLET | Freq: Two times a day (BID) | ORAL | 2 refills | Status: DC
Start: 2022-10-09 — End: 2023-05-20

## 2022-10-09 NOTE — Progress Notes (Signed)
Subjective:    Patient ID: Gina Murray, female    DOB: 11/30/1955, 67 y.o.   MRN: [id number]  Chief Complaint  Patient presents with   Medical Management of Chronic Issues    Patient is fasting. Patient does not want refills sent in today at all.   Knee Pain    Left knee pain.  Seen morgan in may.   Fatigue    "Lack of energy this summer"    spot on chin    Had MRSA before pulled black hair out    Pt presents to the office today for CPE and chronic follow up. She is followed by Gyn annually. Had her Dexa scan 09/02/21 that showed Osteopenia. She is taking Calcium and Vit D.   She retired 09/20/21.  She reports a week ago she pulled a hair out of her chin and since then has a small hard lump that is tender.   Complaining of fatigue for the last few months. Wants labs added.  Knee Pain  The incident occurred more than 1 week ago. There was no injury mechanism. The pain is present in the left knee. The pain is at a severity of 6/10 (0 while sitting, but when standing and walking it is 6). The pain is mild. The pain has been Intermittent since onset. She reports no foreign bodies present. The symptoms are aggravated by weight bearing. She has tried NSAIDs for the symptoms. The treatment provided moderate relief.  Hypertension This is a chronic problem. The current episode started more than 1 year ago. The problem has been resolved since onset. The problem is controlled. Pertinent negatives include no malaise/fatigue, peripheral edema or shortness of breath. Risk factors for coronary artery disease include dyslipidemia, obesity and sedentary lifestyle. The current treatment provides moderate improvement.  Gastroesophageal Reflux She complains of belching and heartburn. This is a chronic problem. The current episode started more than 1 year ago. The problem occurs occasionally. Risk factors include obesity. She has tried a PPI for the symptoms. The treatment provided moderate relief.   Hyperlipidemia This is a chronic problem. The current episode started more than 1 year ago. The problem is controlled. Recent lipid tests were reviewed and are normal. Exacerbating diseases include obesity. Pertinent negatives include no shortness of breath. Current antihyperlipidemic treatment includes statins. The current treatment provides moderate improvement of lipids. Risk factors for coronary artery disease include dyslipidemia, hypertension and a sedentary lifestyle.  Depression        This is a chronic problem.  The current episode started more than 1 year ago.   The problem occurs intermittently.  Associated symptoms include no helplessness, no hopelessness and not sad.  Past treatments include nothing.     Review of Systems  Constitutional:  Negative for malaise/fatigue.  Respiratory:  Negative for shortness of breath.   Gastrointestinal:  Positive for heartburn.  Psychiatric/Behavioral:  Positive for depression.   All other systems reviewed and are negative.  Family History  Problem Relation Age of Onset   Hypertension Mother    Heart disease Mother 48   Atrial fibrillation Mother    Kidney disease Father        Dialysis   Lung cancer Sister    Social History   Socioeconomic History   Marital status: Married    Spouse name: Not on file   Number of children: 1   Years of education: Not on file   Highest education level: Associate degree: academic program  Occupational History  Not on file  Tobacco Use   Smoking status: Never   Smokeless tobacco: Never  Vaping Use   Vaping status: Never Used  Substance and Sexual Activity   Alcohol use: No   Drug use: No   Sexual activity: Not on file  Other Topics Concern   Not on file  Social History Narrative   Not on file   Social Determinants of Health   Financial Resource Strain: Low Risk  (08/19/2022)   Overall Financial Resource Strain (CARDIA)    Difficulty of Paying Living Expenses: Not hard at all  Food  Insecurity: No Food Insecurity (08/19/2022)   Hunger Vital Sign    Worried About Running Out of Food in the Last Year: Never true    Ran Out of Food in the Last Year: Never true  Transportation Needs: No Transportation Needs (08/19/2022)   PRAPARE - Administrator, Civil Service (Medical): No    Lack of Transportation (Non-Medical): No  Physical Activity: Inactive (08/19/2022)   Exercise Vital Sign    Days of Exercise per Week: 0 days    Minutes of Exercise per Session: 30 min  Stress: No Stress Concern Present (08/19/2022)   [employer] of Occupational Health - Occupational Stress Questionnaire    Feeling of Stress : Not at all  Social Connections: Unknown (08/19/2022)   Social Connection and Isolation Panel [NHANES]    Frequency of Communication with Friends and Family: Once a week    Frequency of Social Gatherings with Friends and Family: Once a week    Attends Religious Services: Patient declined    Database administrator or Organizations: No    Attends Banker Meetings: Never    Marital Status: Married       Objective:   Physical Exam Vitals reviewed.  Constitutional:      General: She is not in acute distress.    Appearance: She is well-developed.  HENT:     Head: Normocephalic and atraumatic.     Right Ear: Tympanic membrane normal.     Left Ear: Tympanic membrane normal.  Eyes:     Pupils: Pupils are equal, round, and reactive to light.  Neck:     Thyroid: No thyromegaly.  Cardiovascular:     Rate and Rhythm: Normal rate and regular rhythm.     Heart sounds: Normal heart sounds. No murmur heard. Pulmonary:     Effort: Pulmonary effort is normal. No respiratory distress.     Breath sounds: Normal breath sounds. No wheezing.  Abdominal:     General: Bowel sounds are normal. There is no distension.     Palpations: Abdomen is soft.     Tenderness: There is no abdominal tenderness.  Musculoskeletal:        General: Tenderness present.  Normal range of motion.     Cervical back: Normal range of motion and neck supple.     Comments: Full ROM of left knee  Skin:    General: Skin is warm and dry.     Comments: Small pea size hard abscess, mild tenderness, no drainage.  Neurological:     Mental Status: She is alert and oriented to person, place, and time.     Cranial Nerves: No cranial nerve deficit.     Deep Tendon Reflexes: Reflexes are normal and symmetric.  Psychiatric:        Behavior: Behavior normal.        Thought Content: Thought content normal.  Judgment: Judgment normal.       BP (!) 107/57 Comment: at home  Pulse (!) 54   Temp (!) 97.5 F (36.4 C) (Temporal)   Ht 5\' 3"  (1.6 m)   Wt 178 lb 6.4 oz (80.9 kg)   LMP 08/13/2001   SpO2 96%   BMI 31.60 kg/m      Assessment & Plan:  ELBA SCHABER comes in today with chief complaint of Medical Management of Chronic Issues (Patient is fasting. Patient does not want refills sent in today at all.), Knee Pain (Left knee pain. Avie Echevaria morgan in may.), Fatigue ("Lack of energy this summer" ), and spot on chin (Had MRSA before pulled black hair out )   Diagnosis and orders addressed:  1. Essential hypertension - CMP14+EGFR  2. Hyperlipidemia, unspecified hyperlipidemia type - CMP14+EGFR - Lipid panel  3. Gastroesophageal reflux disease, unspecified whether esophagitis present - CMP14+EGFR  4. Annual physical exam - CMP14+EGFR - Anemia Profile B - Lipid panel - TSH - DG Knee 1-2 Views Left  5. Primary hypertension - CMP14+EGFR  6. Obesity (BMI 30-39.9) - CMP14+EGFR  7. Depression, major, single episode, mild (HCC) - CMP14+EGFR  8. Other fatigue -Labs pending  - CMP14+EGFR - Anemia Profile B - TSH  9. Acute pain of left knee Start Diclofenac BID with food No other NSAID's  - DG Knee 1-2 Views Left - diclofenac (VOLTAREN) 75 MG EC tablet; Take 1 tablet (75 mg total) by mouth 2 (two) times daily.  Dispense: 60 tablet; Refill: 2  10.  Abscess Warm compresses  Start doxycycline  - doxycycline (VIBRA-TABS) 100 MG tablet; Take 1 tablet (100 mg total) by mouth 2 (two) times daily.  Dispense: 20 tablet; Refill: 0   Labs pending Health Maintenance reviewed Diet and exercise encouraged  Follow up plan: 6 months    Jannifer Rodney, FNP

## 2022-10-09 NOTE — Patient Instructions (Signed)
Acute Knee Pain, Adult Acute knee pain is sudden and may be caused by damage, swelling, or irritation of the muscles and tissues that support the knee. Pain may result from: A fall. An injury to the knee from twisting motions. A hit to the knee. Infection. Acute knee pain may go away on its own with time and rest. If it does not, your health care provider may order tests to find the cause of the pain. These may include: Imaging tests, such as an X-ray, MRI, CT scan, or ultrasound. Joint aspiration. In this test, fluid is removed from the knee and evaluated. Arthroscopy. In this test, a lighted tube is inserted into the knee and an image is projected onto a TV screen. Biopsy. In this test, a sample of tissue is removed from the body and studied under a microscope. Follow these instructions at home: If you have a knee sleeve or brace:  Wear the knee sleeve or brace as told by your health care provider. Remove it only as told by your health care provider. Loosen it if your toes tingle, become numb, or turn cold and blue. Keep it clean. If the knee sleeve or brace is not waterproof: Do not let it get wet. Cover it with a watertight covering when you take a bath or shower. Activity Rest your knee. Do not do things that cause pain or make pain worse. Avoid high-impact activities or exercises, such as running, jumping rope, or doing jumping jacks. Work with a physical therapist to make a safe exercise program, as recommended by your health care provider. Do exercises as told by your physical therapist. Managing pain, stiffness, and swelling  If directed, put ice on the affected knee. To do this: If you have a removable knee sleeve or brace, remove it as told by your health care provider. Put ice in a plastic bag. Place a towel between your skin and the bag. Leave the ice on for 20 minutes, 2-3 times a day. Remove the ice if your skin turns bright red. This is very important. If you cannot  feel pain, heat, or cold, you have a greater risk of damage to the area. If directed, use an elastic bandage to put pressure (compression) on your injured knee. This may control swelling, give support, and help with discomfort. Raise (elevate) your knee above the level of your heart while you are sitting or lying down. Sleep with a pillow under your knee. General instructions Take over-the-counter and prescription medicines only as told by your health care provider. Do not use any products that contain nicotine or tobacco, such as cigarettes, e-cigarettes, and chewing tobacco. If you need help quitting, ask your health care provider. If you are overweight, work with your health care provider and a dietitian to set a weight-loss goal that is healthy and reasonable for you. Extra weight can put pressure on your knee. Pay attention to any changes in your symptoms. Keep all follow-up visits. This is important. Contact a health care provider if: Your knee pain continues, changes, or gets worse. You have a fever along with knee pain. Your knee feels warm to the touch or is red. Your knee buckles or locks up. Get help right away if: Your knee swells, and the swelling becomes worse. You cannot move your knee. You have severe pain in your knee that cannot be managed with pain medicine. Summary Acute knee pain can be caused by a fall, an injury, an infection, or damage, swelling, or irritation   of the tissues that support your knee. Your health care provider may perform tests to find out the cause of the pain. Pay attention to any changes in your symptoms. Relieve your pain with rest, medicines, light activity, and the use of ice. Get help right away if your knee swells, you cannot move your knee, or you have severe pain that cannot be managed with medicine. This information is not intended to replace advice given to you by your health care provider. Make sure you discuss any questions you have with  your health care provider. Document Revised: 08/23/2019 Document Reviewed: 08/24/2019 Elsevier Patient Education  2024 [employer]  

## 2022-10-10 LAB — ANEMIA PROFILE B
Basophils Absolute: 0.1 10*3/uL (ref 0.0–0.2)
Basos: 1 %
EOS (ABSOLUTE): 0.4 10*3/uL (ref 0.0–0.4)
Eos: 6 %
Ferritin: 21 ng/mL (ref 15–150)
Folate: 9.9 ng/mL (ref >3.0)
Hematocrit: 39 % (ref 34.0–46.6)
Hemoglobin: 12.6 g/dL (ref 11.1–15.9)
Immature Grans (Abs): 0 10*3/uL (ref 0.0–0.1)
Immature Granulocytes: 0 %
Iron Saturation: 22 % (ref 15–55)
Iron: 92 ug/dL (ref 27–139)
Lymphocytes Absolute: 2.1 10*3/uL (ref 0.7–3.1)
Lymphs: 31 %
MCH: 28 pg (ref 26.6–33.0)
MCHC: 32.3 g/dL (ref 31.5–35.7)
MCV: 87 fL (ref 79–97)
Monocytes Absolute: 0.8 10*3/uL (ref 0.1–0.9)
Monocytes: 11 %
Neutrophils Absolute: 3.4 10*3/uL (ref 1.4–7.0)
Neutrophils: 51 %
Platelets: 309 10*3/uL (ref 150–450)
RBC: 4.5 x10E6/uL (ref 3.77–5.28)
RDW: 15.1 % (ref 11.7–15.4)
Retic Ct Pct: 1.3 % (ref 0.6–2.6)
Total Iron Binding Capacity: 409 ug/dL (ref 250–450)
UIBC: 317 ug/dL (ref 118–369)
Vitamin B-12: 325 pg/mL (ref 232–1245)
WBC: 6.8 10*3/uL (ref 3.4–10.8)

## 2022-10-10 LAB — CMP14+EGFR
ALT: 27 IU/L (ref 0–32)
AST: 28 IU/L (ref 0–40)
Albumin: 4.6 g/dL (ref 3.9–4.9)
Alkaline Phosphatase: 148 IU/L — ABNORMAL HIGH (ref 44–121)
BUN/Creatinine Ratio: 17 (ref 12–28)
BUN: 18 mg/dL (ref 8–27)
Bilirubin Total: 0.6 mg/dL (ref 0.0–1.2)
CO2: 23 mmol/L (ref 20–29)
Calcium: 10.4 mg/dL — ABNORMAL HIGH (ref 8.7–10.3)
Chloride: 104 mmol/L (ref 96–106)
Creatinine, Ser: 1.08 mg/dL — ABNORMAL HIGH (ref 0.57–1.00)
Globulin, Total: 2.6 g/dL (ref 1.5–4.5)
Glucose: 104 mg/dL — ABNORMAL HIGH (ref 70–99)
Potassium: 5.1 mmol/L (ref 3.5–5.2)
Sodium: 142 mmol/L (ref 134–144)
Total Protein: 7.2 g/dL (ref 6.0–8.5)
eGFR: 56 mL/min/{1.73_m2} — ABNORMAL LOW (ref >59)

## 2022-10-10 LAB — LIPID PANEL
Chol/HDL Ratio: 2.3 ratio (ref 0.0–4.4)
Cholesterol, Total: 166 mg/dL (ref 100–199)
HDL: 73 mg/dL (ref >39)
LDL Chol Calc (NIH): 75 mg/dL (ref 0–99)
Triglycerides: 102 mg/dL (ref 0–149)
VLDL Cholesterol Cal: 18 mg/dL (ref 5–40)

## 2022-10-10 LAB — TSH: TSH: 2.2 u[IU]/mL (ref 0.450–4.500)

## 2022-10-17 ENCOUNTER — Telehealth: Payer: Self-pay | Admitting: Family

## 2022-10-17 NOTE — Telephone Encounter (Signed)
Spoke with patient advised that Gi reactions or normal with antibiotic to give it time and eat yogurt. Also to take the medications at different times. She is going to try this and give [place] a call back if it does not get any better.

## 2022-11-30 ENCOUNTER — Other Ambulatory Visit: Payer: Self-pay | Admitting: Family

## 2022-11-30 DIAGNOSIS — I10 Essential (primary) hypertension: Secondary | ICD-10-CM

## 2022-11-30 DIAGNOSIS — E785 Hyperlipidemia, unspecified: Secondary | ICD-10-CM

## 2023-01-13 ENCOUNTER — Ambulatory Visit: Payer: Medicare Other

## 2023-04-10 ENCOUNTER — Ambulatory Visit: Payer: Medicare Other | Admitting: Family

## 2023-04-11 ENCOUNTER — Other Ambulatory Visit: Payer: Self-pay | Admitting: Family

## 2023-04-11 DIAGNOSIS — E785 Hyperlipidemia, unspecified: Secondary | ICD-10-CM

## 2023-04-24 ENCOUNTER — Ambulatory Visit: Payer: Medicare Other | Admitting: Family

## 2023-05-05 ENCOUNTER — Ambulatory Visit: Payer: Medicare Other | Admitting: Family

## 2023-05-21 ENCOUNTER — Encounter: Payer: Self-pay | Admitting: Family

## 2023-05-21 ENCOUNTER — Ambulatory Visit (INDEPENDENT_AMBULATORY_CARE_PROVIDER_SITE_OTHER): Payer: Medicare Other | Admitting: Family

## 2023-05-21 VITALS — BP 133/55 | HR 71 | Temp 97.8°F | Wt 169.8 lb

## 2023-05-21 DIAGNOSIS — I739 Peripheral vascular disease, unspecified: Secondary | ICD-10-CM | POA: Diagnosis not present

## 2023-05-21 DIAGNOSIS — K219 Gastro-esophageal reflux disease without esophagitis: Secondary | ICD-10-CM | POA: Diagnosis not present

## 2023-05-21 DIAGNOSIS — E785 Hyperlipidemia, unspecified: Secondary | ICD-10-CM

## 2023-05-21 DIAGNOSIS — E669 Obesity, unspecified: Secondary | ICD-10-CM

## 2023-05-21 DIAGNOSIS — I10 Essential (primary) hypertension: Secondary | ICD-10-CM | POA: Diagnosis not present

## 2023-05-21 DIAGNOSIS — Z1211 Encounter for screening for malignant neoplasm of colon: Secondary | ICD-10-CM | POA: Diagnosis not present

## 2023-05-21 DIAGNOSIS — F325 Major depressive disorder, single episode, in full remission: Secondary | ICD-10-CM | POA: Diagnosis not present

## 2023-05-21 MED ORDER — ATORVASTATIN CALCIUM 80 MG PO TABS: 80.0000 mg | ORAL_TABLET | Freq: Every day | ORAL | 0 refills | Status: DC

## 2023-05-21 MED ORDER — LISINOPRIL 20 MG PO TABS
20.0000 mg | ORAL_TABLET | Freq: Two times a day (BID) | ORAL | 1 refills | Status: DC
Start: 2023-05-21 — End: 2023-12-25

## 2023-05-21 MED ORDER — METOPROLOL SUCCINATE ER 100 MG PO TB24: 100.0000 mg | ORAL_TABLET | Freq: Every day | ORAL | 1 refills | Status: DC

## 2023-05-21 MED ORDER — OMEPRAZOLE 20 MG PO CPDR
20.0000 mg | DELAYED_RELEASE_CAPSULE | Freq: Every day | ORAL | 3 refills | Status: DC
Start: 2023-05-21 — End: 2023-08-18

## 2023-05-21 MED ORDER — AMLODIPINE BESYLATE 10 MG PO TABS: 10.0000 mg | ORAL_TABLET | Freq: Every day | ORAL | 1 refills | Status: DC

## 2023-05-21 NOTE — Progress Notes (Signed)
 Subjective:    Patient ID: Gina Murray, female    DOB: 25-Dec-1955, 68 y.o.   MRN: [id number]  Chief Complaint  Patient presents with   Medical Management of Chronic Issues    Left knee pain vicks sav helps the diclofenac caused GI issues.   Pt presents to the office today for  chronic follow up. She is followed by Gyn annually.   Had her Dexa scan 09/02/21 that showed Osteopenia. She is taking Calcium and Vit D.   She retired 09/20/21. Complaining of fatigue for the last few months. Wants labs added.  Hypertension This is a chronic problem. The current episode started more than 1 year ago. The problem has been waxing and waning since onset. The problem is uncontrolled. Pertinent negatives include no malaise/fatigue, peripheral edema or shortness of breath. Risk factors for coronary artery disease include dyslipidemia, obesity and sedentary lifestyle. The current treatment provides moderate improvement.  Gastroesophageal Reflux She complains of belching and heartburn. This is a chronic problem. The current episode started more than 1 year ago. The problem occurs occasionally. Risk factors include obesity. She has tried a PPI for the symptoms. The treatment provided moderate relief.  Hyperlipidemia This is a chronic problem. The current episode started more than 1 year ago. The problem is controlled. Recent lipid tests were reviewed and are normal. Exacerbating diseases include obesity. Pertinent negatives include no shortness of breath. Current antihyperlipidemic treatment includes statins. The current treatment provides moderate improvement of lipids. Risk factors for coronary artery disease include dyslipidemia, hypertension and a sedentary lifestyle.  Depression        This is a chronic problem.  The current episode started more than 1 year ago.   The problem occurs intermittently.  Associated symptoms include no helplessness, no hopelessness and not sad.  Past treatments include  nothing.     Review of Systems  Constitutional:  Negative for malaise/fatigue.  Respiratory:  Negative for shortness of breath.   Gastrointestinal:  Positive for heartburn.  All other systems reviewed and are negative.  Family History  Problem Relation Age of Onset   Hypertension Mother    Heart disease Mother 96   Atrial fibrillation Mother    Kidney disease Father        Dialysis   Lung cancer Sister    Social History   Socioeconomic History   Marital status: Married    Spouse name: Not on file   Number of children: 1   Years of education: Not on file   Highest education level: Associate degree: occupational, Scientist, product/process development, or vocational program  Occupational History   Not on file  Tobacco Use   Smoking status: Never   Smokeless tobacco: Never  Vaping Use   Vaping status: Never Used  Substance and Sexual Activity   Alcohol use: No   Drug use: No   Sexual activity: Not on file  Other Topics Concern   Not on file  Social History Narrative   Not on file   Social Drivers of Health   Financial Resource Strain: Low Risk  (05/17/2023)   Overall Financial Resource Strain (CARDIA)    Difficulty of Paying Living Expenses: Not hard at all  Food Insecurity: No Food Insecurity (05/17/2023)   Hunger Vital Sign    Worried About Running Out of Food in the Last Year: Never true    Ran Out of Food in the Last Year: Never true  Transportation Needs: No Transportation Needs (05/17/2023)   PRAPARE - Transportation  Lack of Transportation (Medical): No    Lack of Transportation (Non-Medical): No  Physical Activity: Unknown (05/17/2023)   Exercise Vital Sign    Days of Exercise per Week: 0 days    Minutes of Exercise per Session: Not on file  Recent Concern: Physical Activity - Inactive (05/04/2023)   Exercise Vital Sign    Days of Exercise per Week: 0 days    Minutes of Exercise per Session: 30 min  Stress: Stress Concern Present (05/17/2023)   [employer] of Occupational  Health - Occupational Stress Questionnaire    Feeling of Stress : To some extent  Social Connections: Unknown (05/17/2023)   Social Connection and Isolation Panel [NHANES]    Frequency of Communication with Friends and Family: Once a week    Frequency of Social Gatherings with Friends and Family: More than three times a week    Attends Religious Services: Patient declined    Database administrator or Organizations: No    Attends Engineer, structural: Not on file    Marital Status: Married       Objective:   Physical Exam Vitals reviewed.  Constitutional:      General: She is not in acute distress.    Appearance: She is well-developed.  HENT:     Head: Normocephalic and atraumatic.     Right Ear: Tympanic membrane normal.     Left Ear: Tympanic membrane normal.  Eyes:     Pupils: Pupils are equal, round, and reactive to light.  Neck:     Thyroid: No thyromegaly.  Cardiovascular:     Rate and Rhythm: Normal rate and regular rhythm.     Heart sounds: Normal heart sounds. No murmur heard. Pulmonary:     Effort: Pulmonary effort is normal. No respiratory distress.     Breath sounds: Normal breath sounds. No wheezing.  Abdominal:     General: Bowel sounds are normal. There is no distension.     Palpations: Abdomen is soft.     Tenderness: There is no abdominal tenderness.  Musculoskeletal:        General: No tenderness. Normal range of motion.     Cervical back: Normal range of motion and neck supple.     Comments: Discoloration of bilateral lower legs  Skin:    General: Skin is warm and dry.  Neurological:     Mental Status: She is alert and oriented to person, place, and time.     Cranial Nerves: No cranial nerve deficit.     Deep Tendon Reflexes: Reflexes are normal and symmetric.  Psychiatric:        Mood and Affect: Mood is anxious.        Behavior: Behavior normal.        Thought Content: Thought content normal.        Judgment: Judgment normal.        BP (!) 151/72   Pulse 71   Temp 97.8 F (36.6 C) (Temporal)   Wt 169 lb 12.8 oz (77 kg)   LMP 08/13/2001   SpO2 98%   BMI 30.08 kg/m      Assessment & Plan:  Gina Murray comes in today with chief complaint of Medical Management of Chronic Issues (Left knee pain vicks sav helps the diclofenac caused GI issues.)   Diagnosis and orders addressed:  1. Essential hypertension - amLODipine (NORVASC) 10 MG tablet; Take 1 tablet (10 mg total) by mouth daily.  Dispense: 90 tablet; Refill: 1 -  lisinopril (ZESTRIL) 20 MG tablet; Take 1 tablet (20 mg total) by mouth 2 (two) times daily.  Dispense: 180 tablet; Refill: 1 - metoprolol succinate (TOPROL-XL) 100 MG 24 hr tablet; Take 1 tablet (100 mg total) by mouth daily. TAKE WITH OR IMMEDIATELY FOLLOWING A MEAL.  Dispense: 90 tablet; Refill: 1 - CMP14+EGFR  2. Hyperlipidemia, unspecified hyperlipidemia type - atorvastatin (LIPITOR) 80 MG tablet; Take 1 tablet (80 mg total) by mouth daily.  Dispense: 90 tablet; Refill: 0 - CMP14+EGFR  3. Gastroesophageal reflux disease, unspecified whether esophagitis present - omeprazole (PRILOSEC) 20 MG capsule; Take 1 capsule (20 mg total) by mouth daily.  Dispense: 30 capsule; Refill: 3 - CMP14+EGFR  4. Depression, major, single episode, complete remission (HCC) (Primary) - CMP14+EGFR  5. Primary hypertension - CMP14+EGFR  6. Obesity (BMI 30-39.9) - CMP14+EGFR  7. PAD (peripheral artery disease) (HCC) - Ambulatory referral to Vascular Surgery  8. Colon cancer screening - Ambulatory referral to Gastroenterology   Labs pending Continue current medications  Referral to Vascular and GI Health Maintenance reviewed Diet and exercise encouraged  Follow up plan: 6 months    Jannifer Rodney, FNP

## 2023-05-21 NOTE — Patient Instructions (Signed)
 Preventing Poor Blood Flow (Peripheral Vascular Disease)  Peripheral vascular disease (PVD) is a disease of the blood vessels. It usually affects arteries, which are the blood vessels that carry blood from the heart to the rest of the body. It's also called peripheral artery disease (PAD), poor blood flow, or poor circulation. PVD happens when a fatty substance called plaque builds up in the arteries. This decreases blood flow. The blood vessels that carry blood from the heart to the lower legs and feet are most often affected. Other areas commonly affected blood vessels in the arms, kidneys, and stomach. How can peripheral vascular disease affect me? PVD affects the way blood flows to areas of your body. If steps are not taken to prevent PVD, it can cause: Pain, cramps, and weakness in the butt, legs, and feet when you're active. This is called intermittent claudication. It gets better with rest. Pain, numbness, tingling, or weakness in areas of poor blood flow. This may include pain at night or pain when you're resting. Skin changes, hair loss, and thick toenails. Open wounds and sores on toes, feet, or legs. The wounds or sores may take longer than normal to heal. Tissue death in areas with poor blood flow. Surgery to remove these areas of dead tissue may be needed. This may also lead to removal, also called amputation, of toes or a leg. Acute limb ischemia. This happens when there's a sudden stop of blood flow to an arm or leg. This causes severe pain and numbness. This is a medical emergency. PVD also increases your risk of serious health problems, including: Stroke. Stroke-like symptoms. This is called transient ischemic attack or TIA. Heart disease. Heart attack. Blood clots. What actions can I take to prevent peripheral vascular disease?  Do not smoke, vape, or use nicotine or tobacco. Avoid drinking alcohol. Talk with your provider about staying at a healthy weight. If needed, ask  about losing weight. Get regular exercise. Ask your provider about good activities for you. Eat a heart-healthy diet. Choose low-sugar, low-sodium foods, and eat plenty of whole grains, fiber, fruits, and vegetables. Manage health conditions you have. These may include: Diabetes. Heart disease. High blood pressure. High cholesterol. Where to find more information To learn more, go to these websites: Society for Vascular Surgery: vascular.org American Heart Association: VipAnalysis.is National Heart, Lung, and Blood Institute: https://robinson-schaefer.info/ Contact a health care provider if: You have new pain in your legs, arms, or other areas of your body. You have leg pain when you're resting or cramps in your leg when you walk. You have signs of infection, such as redness or swelling. You have a sore on your leg or foot that isn't healing. Get help right away if: You have chest pain. You have very bad pain that doesn't go away and a cold, pale arm or leg. Your skin changes color. You have trouble talking or moving one side of your body. These symptoms may be an emergency. Call 911 right away. Do not wait to see if the symptoms will go away. Do not drive yourself to the hospital. This information is not intended to replace advice given to you by your health care provider. Make sure you discuss any questions you have with your health care provider. Document Revised: 09/14/2022 Document Reviewed: 09/13/2022 Elsevier Patient Education  2024 [employer].

## 2023-05-22 LAB — CMP14+EGFR
ALT: 20 IU/L (ref 0–32)
AST: 26 IU/L (ref 0–40)
Albumin: 4.4 g/dL (ref 3.9–4.9)
Alkaline Phosphatase: 140 IU/L — ABNORMAL HIGH (ref 44–121)
BUN/Creatinine Ratio: 23 (ref 12–28)
BUN: 19 mg/dL (ref 8–27)
Bilirubin Total: 0.7 mg/dL (ref 0.0–1.2)
CO2: 22 mmol/L (ref 20–29)
Calcium: 9.8 mg/dL (ref 8.7–10.3)
Chloride: 104 mmol/L (ref 96–106)
Creatinine, Ser: 0.83 mg/dL (ref 0.57–1.00)
Globulin, Total: 2.5 g/dL (ref 1.5–4.5)
Glucose: 91 mg/dL (ref 70–99)
Potassium: 4.1 mmol/L (ref 3.5–5.2)
Sodium: 141 mmol/L (ref 134–144)
Total Protein: 6.9 g/dL (ref 6.0–8.5)
eGFR: 77 mL/min/{1.73_m2} (ref >59)

## 2023-05-28 ENCOUNTER — Other Ambulatory Visit: Payer: Self-pay | Admitting: *Deleted

## 2023-05-28 DIAGNOSIS — I739 Peripheral vascular disease, unspecified: Secondary | ICD-10-CM

## 2023-06-01 ENCOUNTER — Telehealth: Payer: Self-pay | Admitting: Internal Medicine

## 2023-06-01 NOTE — Telephone Encounter (Signed)
 Good morning Dr. Leone Payor   Doc of the Day AM 3/10   We received a call from this patient wishing to schedule a colonoscopy. Patient last had colonoscopy in 04/2016 with Dr. Karilyn Cota with [facility]. Since then he has retired and patient would like to schedule with our practice due to being referred. Records are in Epic for you to review. Would you please advise on scheduling?  Thank you.

## 2023-06-01 NOTE — Telephone Encounter (Signed)
 Chart reviewed - the plan was to repeat a colonoscopy in 2028 - 10 years from last  The earliest I would recommend a routine one would be 7 years and that is June of this year if she is interested in doing sooner than 10 and would be ok to schedule w/ any provider  Is she having signs or symptoms of gastrointestinal problems? - if so then she needs an office visit with one of our providers

## 2023-06-02 ENCOUNTER — Ambulatory Visit (HOSPITAL_COMMUNITY)
Admission: RE | Admit: 2023-06-02 | Discharge: 2023-06-02 | Disposition: A | Discharge status: Home / Self Care | Source: Ambulatory Visit | Attending: Vascular Surgery | Admitting: Vascular Surgery

## 2023-06-02 DIAGNOSIS — I739 Peripheral vascular disease, unspecified: Secondary | ICD-10-CM | POA: Insufficient documentation

## 2023-06-02 LAB — VAS US ABI WITH/WO TBI
Left ABI: 1.15
Right ABI: 1.24

## 2023-06-02 NOTE — Telephone Encounter (Signed)
 Spoke with patient, states she would like to schedule for June of this year. Advised patient she would have to call back at the beginning of April to schedule for then.

## 2023-06-08 NOTE — Progress Notes (Unsigned)
 [facility] [place]  ASSESSMENT / PLAN: Gina Murray is a 68 y.o. female with chronic venous insufficiency of bilateral lower extremities.  She has some hemosiderin deposition in her lower extremities, making the skin changes seen on clinical exam.  She has easily palpable pedal pulses, and no concern for peripheral arterial disease.  She can follow-up with me as needed with a venous reflux duplex, if she would like to consider endovenous ablation.  At the moment she is not interested in this.  CHIEF COMPLAINT: Discolored lower legs  HISTORY OF PRESENT ILLNESS: Gina Murray is a 68 y.o. female referred to clinic for evaluation of discoloration of the bilateral lower extremities.  The patient has a brawny color to the feet and ankles bilaterally.  The patient initially thought this was from sun exposure, but she has noticed this in the winter months as well.  She does notice some occasional swelling in her legs.  She does have some scattered reticular veins about her legs.  These are not very bothersome to her.  The color is mostly what is bothersome to her.  We reviewed the natural history of chronic venous insufficiency in detail.     Past Medical History:  Diagnosis Date   GERD (gastroesophageal reflux disease) 04/22/2013   Hyperlipidemia    Hypertension    Multiple skin nodules    Vitamin D deficiency     Past Surgical History:  Procedure Laterality Date   CATARACT EXTRACTION W/PHACO Right 01/30/2014   Procedure: CATARACT EXTRACTION PHACO AND INTRAOCULAR LENS PLACEMENT (IOC);  Surgeon: Gemma Payor, MD;  Location: [facility];  Service: Ophthalmology;  Laterality: Right;  CDE 13.53   CESAREAN SECTION     COLONOSCOPY N/A 08/27/2016   Procedure: COLONOSCOPY;  Surgeon: Malissa Hippo, MD;  Location: [facility];  Service: Endoscopy;  Laterality: N/A;  830    Family History  Problem Relation Age of Onset   Hypertension Mother    Heart disease Mother 50   Atrial  fibrillation Mother    Kidney disease Father        Dialysis   Lung cancer Sister     Social History   Socioeconomic History   Marital status: Married    Spouse name: Not on file   Number of children: 1   Years of education: Not on file   Highest education level: Associate degree: occupational, Scientist, product/process development, or vocational program  Occupational History   Not on file  Tobacco Use   Smoking status: Never   Smokeless tobacco: Never  Vaping Use   Vaping status: Never Used  Substance and Sexual Activity   Alcohol use: No   Drug use: No   Sexual activity: Not on file  Other Topics Concern   Not on file  Social History Narrative   Not on file   Social Drivers of Health   Financial Resource Strain: Low Risk  (05/17/2023)   Overall Financial Resource Strain (CARDIA)    Difficulty of Paying Living Expenses: Not hard at all  Food Insecurity: No Food Insecurity (05/17/2023)   Hunger Vital Sign    Worried About Running Out of Food in the Last Year: Never true    Ran Out of Food in the Last Year: Never true  Transportation Needs: No Transportation Needs (05/17/2023)   PRAPARE - Administrator, Civil Service (Medical): No    Lack of Transportation (Non-Medical): No  Physical Activity: Unknown (05/17/2023)   Exercise Vital Sign  Days of Exercise per Week: 0 days    Minutes of Exercise per Session: Not on file  Recent Concern: Physical Activity - Inactive (05/04/2023)   Exercise Vital Sign    Days of Exercise per Week: 0 days    Minutes of Exercise per Session: 30 min  Stress: Stress Concern Present (05/17/2023)   [employer] of Occupational Health - Occupational Stress Questionnaire    Feeling of Stress : To some extent  Social Connections: Unknown (05/17/2023)   Social Connection and Isolation Panel [NHANES]    Frequency of Communication with Friends and Family: Once a week    Frequency of Social Gatherings with Friends and Family: More than three times a week     Attends Religious Services: Patient declined    Database administrator or Organizations: No    Attends Engineer, structural: Not on file    Marital Status: Married  Catering manager Violence: Not At Risk (05/12/2022)   Humiliation, Afraid, Rape, and Kick questionnaire    Fear of Current or Ex-Partner: No    Emotionally Abused: No    Physically Abused: No    Sexually Abused: No    Allergies  Allergen Reactions   Latex     Had hand rash yrs ago and was told "could be allergic " but pt states "not a definite thing"   Hctz [Hydrochlorothiazide] Rash   Penicillins Rash    Has patient had a PCN reaction causing immediate rash, facial/tongue/throat swelling, SOB or lightheadedness with hypotension: Yes Has patient had a PCN reaction causing severe rash involving mucus membranes or skin necrosis: Unknown Has patient had a PCN reaction that required hospitalization: No Has patient had a PCN reaction occurring within the last 10 years: No If all of the above answers are "NO", then may proceed with Cephalosporin use.    Sulfa Antibiotics Rash    Current Outpatient Medications  Medication Sig Dispense Refill   amLODipine (NORVASC) 10 MG tablet Take 1 tablet (10 mg total) by mouth daily. 90 tablet 1   aspirin EC 81 MG tablet Take 81 mg by mouth daily.     atorvastatin (LIPITOR) 80 MG tablet Take 1 tablet (80 mg total) by mouth daily. 90 tablet 0   betamethasone valerate ointment (VALISONE) 0.1 % Apply 1 Application topically 2 (two) times daily. 30 g 0   Calcium Carbonate-Vitamin D (CALCIUM 600+D PO) Take 2 tablets by mouth daily.     cetirizine (ZYRTEC) 10 MG tablet Take 10 mg by mouth daily.     Cholecalciferol (VITAMIN D) 2000 UNITS tablet Take 2,000 Units by mouth daily.     Hypromellose (ARTIFICIAL TEARS OP) Place 1-2 drops into both eyes 2 (two) times daily as needed (dry eyes).     lisinopril (ZESTRIL) 20 MG tablet Take 1 tablet (20 mg total) by mouth 2 (two) times daily. 180  tablet 1   metoprolol succinate (TOPROL-XL) 100 MG 24 hr tablet Take 1 tablet (100 mg total) by mouth daily. TAKE WITH OR IMMEDIATELY FOLLOWING A MEAL. 90 tablet 1   omeprazole (PRILOSEC) 20 MG capsule Take 1 capsule (20 mg total) by mouth daily. 30 capsule 3   No current facility-administered medications for this visit.    PHYSICAL EXAM There were no vitals filed for this visit.  Elderly woman, appearing younger than stated age Regular rate and rhythm Unlabored breathing 2+ dorsalis pedis pulses bilaterally Mild chronic venous insufficiency of bilateral lower extremities with hemosiderin deposition causing brawny discoloration of  the feet and ankles bilaterally.  No edema.  PERTINENT LABORATORY AND RADIOLOGIC DATA  Most recent CBC    Latest Ref Rng & Units 10/09/2022    9:10 AM 10/07/2021    8:53 AM 04/08/2021    9:01 AM  CBC  WBC 3.4 - 10.8 x10E3/uL 6.8  5.7  7.0   Hemoglobin 11.1 - 15.9 g/dL 40.1  02.7  25.3   Hematocrit 34.0 - 46.6 % 39.0  38.2  38.9   Platelets 150 - 450 x10E3/uL 309  290  293      Most recent CMP    Latest Ref Rng & Units 05/21/2023   12:31 PM 10/09/2022    9:10 AM 04/10/2022    8:56 AM  CMP  Glucose 70 - 99 mg/dL 91  664  94   BUN 8 - 27 mg/dL 19  18  19    Creatinine 0.57 - 1.00 mg/dL 4.03  4.74  2.59   Sodium 134 - 144 mmol/L 141  142  143   Potassium 3.5 - 5.2 mmol/L 4.1  5.1  4.1   Chloride 96 - 106 mmol/L 104  104  102   CO2 20 - 29 mmol/L 22  23  24    Calcium 8.7 - 10.3 mg/dL 9.8  56.3  87.5   Total Protein 6.0 - 8.5 g/dL 6.9  7.2  7.0   Total Bilirubin 0.0 - 1.2 mg/dL 0.7  0.6  0.6   Alkaline Phos 44 - 121 IU/L 140  148  137   AST 0 - 40 IU/L 26  28  26    ALT 0 - 32 IU/L 20  27  24      Renal function CrCl cannot be calculated (Unknown ideal weight.).  Hemoglobin A1C (no units)  Date Value  12/16/2012 5.4    LDL (calc)  Date Value Ref Range Status  08/13/2012 92 <100 mg/dL Final    Comment:    LDL-C is inaccurate if patient is  nonfasting.   Reference Range: ---------------- Optimal:            <100 Near/Above Optimal: 100-129 Borderline High:    130-159 High:               160-189 Very High:          >=190       LDLC SERPL CALC-MCNC  Date Value Ref Range Status  04/22/2013 75 <100 mg/dL Final    Comment:    LDL-C is inaccurate if patient is nonfasting.                           Optimal               <  100                           Above optimal     100 -  129                           Borderline        130 -  159                           High              160 -  189  Very high             >  189   LDL Chol Calc (NIH)  Date Value Ref Range Status  10/09/2022 75 0 - 99 mg/dL Final    +-------+-----------+-----------+------------+------------+  ABI/TBIToday's ABIToday's TBIPrevious ABIPrevious TBI  +-------+-----------+-----------+------------+------------+  Right 1.24       0.91                                 +-------+-----------+-----------+------------+------------+  Left  1.15       0.98                                 +-------+-----------+-----------+------------+------------+   Rande Brunt. Lenell Antu, MD FACS [facility] Phone Number: [phone] 06/08/2023 8:27 PM   Total time spent on preparing this encounter including chart review, data review, collecting history, examining the patient, coordinating care for this new patient, 45 minutes.  Portions of this report may have been transcribed using voice recognition software.  Every effort has been made to ensure accuracy; however, inadvertent computerized transcription errors may still be present.

## 2023-06-09 ENCOUNTER — Encounter: Payer: Self-pay | Admitting: Vascular Surgery

## 2023-06-09 ENCOUNTER — Ambulatory Visit: Admitting: Vascular Surgery

## 2023-06-09 VITALS — BP 173/82 | HR 75 | Ht 63.0 in | Wt 173.0 lb

## 2023-06-09 DIAGNOSIS — I872 Venous insufficiency (chronic) (peripheral): Secondary | ICD-10-CM | POA: Diagnosis not present

## 2023-06-13 ENCOUNTER — Encounter: Payer: Self-pay | Admitting: Vascular Surgery

## 2023-08-16 ENCOUNTER — Other Ambulatory Visit: Payer: Self-pay | Admitting: Family

## 2023-08-16 DIAGNOSIS — K219 Gastro-esophageal reflux disease without esophagitis: Secondary | ICD-10-CM

## 2023-08-19 ENCOUNTER — Ambulatory Visit: Payer: Medicare Other

## 2023-08-19 VITALS — BP 173/82 | HR 75 | Ht 63.0 in | Wt 173.0 lb

## 2023-08-19 DIAGNOSIS — Z0000 Encounter for general adult medical examination without abnormal findings: Secondary | ICD-10-CM

## 2023-08-19 NOTE — Patient Instructions (Signed)
 Gina Murray , Thank you for taking time out of your busy schedule to complete your Annual Wellness Visit with me. I enjoyed our conversation and look forward to speaking with you again next year. I, as well as your care team,  appreciate your ongoing commitment to your health goals. Please review the following plan we discussed and let me know if I can assist you in the future. Your Game plan/ To Do List    Follow up Visits: Next Medicare AWV with our clinical staff: 08/19/24 at 1:50p.m.   Next Office Visit with your provider: 09/19/23 at 8:10a.m.  Clinician Recommendations:  Aim for 30 minutes of exercise or brisk walking, 6-8 glasses of water , and 5 servings of fruits and vegetables each day. N/a      This is a list of the screening recommended for you and due dates:  Health Maintenance  Topic Date Due   COVID-19 Vaccine (8 - Mixed Product risk 2024-25 season) 09/03/2024*   DEXA scan (bone density measurement)  09/03/2023   Mammogram  09/05/2023   Flu Shot  10/23/2023   DTaP/Tdap/Td vaccine (2 - Td or Tdap) 01/08/2024   Medicare Annual Wellness Visit  08/18/2024   Colon Cancer Screening  08/28/2026   Pneumonia Vaccine  Completed   Hepatitis C Screening  Completed   Zoster (Shingles) Vaccine  Completed   HPV Vaccine  Aged Out   Meningitis B Vaccine  Aged Out  *Topic was postponed. The date shown is not the original due date.    Advanced directives: (Declined) Advance directive discussed with you today. Even though you declined this today, please call our office should you change your mind, and we can give you the proper paperwork for you to fill out. Advance Care Planning is important because it:  [x]  Makes sure you receive the medical care that is consistent with your values, goals, and preferences  [x]  It provides guidance to your family and loved ones and reduces their decisional burden about whether or not they are making the right decisions based on your wishes.  Follow the  link provided in your after visit summary or read over the paperwork we have mailed to you to help you started getting your Advance Directives in place. If you need assistance in completing these, please reach out to us  so that we can help you!  See attachments for Preventive Care and Fall Prevention Tips.

## 2023-08-19 NOTE — Progress Notes (Signed)
 Subjective:   Gina Murray is a 68 y.o. who presents for a Medicare Wellness preventive visit.  As a reminder, Annual Wellness Visits don't include a physical exam, and some assessments may be limited, especially if this visit is performed virtually. We may recommend an in-person follow-up visit with your provider if needed.  Visit Complete: Virtual I connected with  Gina Murray on 08/19/23 by a audio enabled telemedicine application and verified that I am speaking with the correct person using two identifiers.  Patient Location: Home  Provider Location: Home Office  I discussed the limitations of evaluation and management by telemedicine. The patient expressed understanding and agreed to proceed.  Vital Signs: Because this visit was a virtual/telehealth visit, some criteria may be missing or patient reported. Any vitals not documented were not able to be obtained and vitals that have been documented are patient reported.  VideoDeclined- This patient declined Librarian, academic. Therefore the visit was completed with audio only.  Persons Participating in Visit: Patient.  AWV Questionnaire: Yes: Patient Medicare AWV questionnaire was completed by the patient on 05/07/23; I have confirmed that all information answered by patient is correct and no changes since this date.  Cardiac Risk Factors include: advanced age (>65men, >71 women);dyslipidemia;hypertension;obesity (BMI >30kg/m2)     Objective:     Today's Vitals   08/19/23 1442  BP: (!) 173/82  Pulse: 75  Weight: 173 lb (78.5 kg)  Height: 5\' 3"  (1.6 m)   Body mass index is 30.65 kg/m.     08/19/2023    2:38 PM 05/12/2022    8:51 AM 08/27/2016    7:25 AM 01/30/2014    9:37 AM 01/25/2014    3:01 PM  Advanced Directives  Does Patient Have a Medical Advance Directive? No No No No No  Would patient like information on creating a medical advance directive?  No - Patient declined No - Patient  declined No - patient declined information No - patient declined information    Current Medications (verified) Outpatient Encounter Medications as of 08/19/2023  Medication Sig   amLODipine  (NORVASC ) 10 MG tablet Take 1 tablet (10 mg total) by mouth daily.   aspirin EC 81 MG tablet Take 81 mg by mouth daily.   atorvastatin  (LIPITOR) 80 MG tablet Take 1 tablet (80 mg total) by mouth daily.   betamethasone  valerate ointment (VALISONE ) 0.1 % Apply 1 Application topically 2 (two) times daily.   Calcium  Carbonate-Vitamin D  (CALCIUM  600+D PO) Take 2 tablets by mouth daily.   cetirizine (ZYRTEC) 10 MG tablet Take 10 mg by mouth daily.   Cholecalciferol (VITAMIN D ) 2000 UNITS tablet Take 2,000 Units by mouth daily.   Hypromellose (ARTIFICIAL TEARS OP) Place 1-2 drops into both eyes 2 (two) times daily as needed (dry eyes).   lisinopril  (ZESTRIL ) 20 MG tablet Take 1 tablet (20 mg total) by mouth 2 (two) times daily.   metoprolol  succinate (TOPROL -XL) 100 MG 24 hr tablet Take 1 tablet (100 mg total) by mouth daily. TAKE WITH OR IMMEDIATELY FOLLOWING A MEAL.   omeprazole  (PRILOSEC) 20 MG capsule TAKE 1 CAPSULE BY MOUTH EVERY DAY   No facility-administered encounter medications on file as of 08/19/2023.    Allergies (verified) Latex, Hctz [hydrochlorothiazide ], Penicillins, and Sulfa antibiotics   History: Past Medical History:  Diagnosis Date   Allergy    Arthritis 2024   X-Ray showed arthritis   GERD (gastroesophageal reflux disease) 04/22/2013   Hyperlipidemia    Hypertension  Multiple skin nodules    Vitamin D  deficiency    Past Surgical History:  Procedure Laterality Date   CATARACT EXTRACTION W/PHACO Right 01/30/2014   Procedure: CATARACT EXTRACTION PHACO AND INTRAOCULAR LENS PLACEMENT (IOC);  Surgeon: Anner Kill, MD;  Location: [facility];  Service: Ophthalmology;  Laterality: Right;  CDE 13.53   CESAREAN SECTION     COLONOSCOPY N/A 08/27/2016   Procedure: COLONOSCOPY;  Surgeon:  Ruby Corporal, MD;  Location: [facility];  Service: Endoscopy;  Laterality: N/A;  830   EYE SURGERY     Removal of cataracts   Family History  Problem Relation Age of Onset   Hypertension Mother    Heart disease Mother 73   Atrial fibrillation Mother    Kidney disease Father        Dialysis   Vision loss Father    Lung cancer Sister    Social History   Socioeconomic History   Marital status: Married    Spouse name: Not on file   Number of children: 1   Years of education: Not on file   Highest education level: Associate degree: occupational, Scientist, product/process development, or vocational program  Occupational History   Not on file  Tobacco Use   Smoking status: Never   Smokeless tobacco: Never  Vaping Use   Vaping status: Never Used  Substance and Sexual Activity   Alcohol use: No   Drug use: No   Sexual activity: Not Currently    Birth control/protection: Post-menopausal  Other Topics Concern   Not on file  Social History Narrative   Not on file   Social Drivers of Health   Financial Resource Strain: Low Risk  (08/19/2023)   Overall Financial Resource Strain (CARDIA)    Difficulty of Paying Living Expenses: Not hard at all  Food Insecurity: No Food Insecurity (05/17/2023)   Hunger Vital Sign    Worried About Running Out of Food in the Last Year: Never true    Ran Out of Food in the Last Year: Never true  Transportation Needs: No Transportation Needs (05/17/2023)   PRAPARE - Transportation    Lack of Transportation (Medical): No    Lack of Transportation (Non-Medical): No  Physical Activity: Unknown (05/17/2023)   Exercise Vital Sign    Days of Exercise per Week: 0 days    Minutes of Exercise per Session: Not on file  Recent Concern: Physical Activity - Inactive (05/04/2023)   Exercise Vital Sign    Days of Exercise per Week: 0 days    Minutes of Exercise per Session: 30 min  Stress: No Stress Concern Present (08/19/2023)   [employer] of Occupational Health -  Occupational Stress Questionnaire    Feeling of Stress : Not at all  Social Connections: Moderately Isolated (08/19/2023)   Social Connection and Isolation Panel [NHANES]    Frequency of Communication with Friends and Family: Once a week    Frequency of Social Gatherings with Friends and Family: More than three times a week    Attends Religious Services: Never    Database administrator or Organizations: No    Attends Engineer, structural: Never    Marital Status: Married    Tobacco Counseling Counseling given: Yes    Clinical Intake:  Pre-visit preparation completed: Yes  Pain : No/denies pain     Nutritional Risks: None Diabetes: No  Lab Results  Component Value Date   HGBA1C 5.4 12/16/2012     How often do you need to  have someone help you when you read instructions, pamphlets, or other written materials from your doctor or pharmacy?: 1 - Never  Interpreter Needed?: No  Information entered by :: Alia t/cma   Activities of Daily Living     05/07/2023   11:54 AM  In your present state of health, do you have any difficulty performing the following activities:  Hearing? 0  Vision? 1  Difficulty concentrating or making decisions? 1  Walking or climbing stairs? 0  Dressing or bathing? 0  Doing errands, shopping? 0  Preparing Food and eating ? N  Using the Toilet? N  In the past six months, have you accidently leaked urine? N  Do you have problems with loss of bowel control? N  Managing your Medications? N  Managing your Finances? N  Housekeeping or managing your Housekeeping? N    Patient Care Team: Yevette Hem, FNP as PCP - General (Nurse Practitioner)  Indicate any recent Medical Services you may have received from other than [facility] providers in the past year (date may be approximate).     Assessment:    This is a routine wellness examination for Gina Murray.  Hearing/Vision screen Hearing Screening - Comments:: Pt denies hearing dif Vision  Screening - Comments:: Pt wear glasses/pt plans to make an eye appt soon have noticed blurred   Goals Addressed             This Visit's Progress    Exercise 3x per week (30 min per time)   On track      Depression Screen     08/19/2023    2:39 PM 05/21/2023   11:42 AM 08/20/2022    8:24 AM 05/12/2022    8:50 AM 04/10/2022    8:15 AM 11/18/2021    3:16 PM 10/07/2021    8:41 AM  PHQ 2/9 Scores  PHQ - 2 Score 0 0 0 0 0 1 0  PHQ- 9 Score  0 0 0 0 4 1    Fall Risk     05/21/2023   11:42 AM 05/07/2023   11:54 AM 08/20/2022    8:24 AM 05/12/2022    8:48 AM 04/10/2022    8:15 AM  Fall Risk   Falls in the past year? 1 1 0 0 0  Number falls in past yr: 0 0  0   Injury with Fall? 1 0  0   Risk for fall due to : Impaired balance/gait   No Fall Risks   Follow up Falls evaluation completed;Education provided   Falls prevention discussed     MEDICARE RISK AT HOME:  Medicare Risk at Home Any stairs in or around the home?: (Patient-Rptd) Yes If so, are there any without handrails?: (Patient-Rptd) Yes Home free of loose throw rugs in walkways, pet beds, electrical cords, etc?: (Patient-Rptd) No Adequate lighting in your home to reduce risk of falls?: (Patient-Rptd) Yes Life alert?: (Patient-Rptd) No Use of a cane, walker or w/c?: (Patient-Rptd) No Grab bars in the bathroom?: (Patient-Rptd) Yes Shower chair or bench in shower?: (Patient-Rptd) No Elevated toilet seat or a handicapped toilet?: (Patient-Rptd) Yes  TIMED UP AND GO:  Was the test performed?  No  Cognitive Function: 6CIT completed        08/19/2023    2:41 PM 05/12/2022    8:51 AM  6CIT Screen  What Year? 0 points 0 points  What month? 0 points 0 points  What time? 0 points 0 points  Count back from 20  0 points 0 points  Months in reverse 0 points 0 points  Repeat phrase 0 points 0 points  Total Score 0 points 0 points    Immunizations Immunization History  Administered Date(s) Administered   Fluad Quad(high  Dose 65+) 01/03/2021, 01/15/2022   Influenza, High Dose Seasonal PF 01/12/2023   Influenza,inj,Quad PF,6+ Mos 12/16/2012, 01/07/2014, 01/04/2015, 12/22/2015, 01/06/2017, 12/18/2017, 12/31/2018, 01/18/2020   Moderna Covid-19 Fall Seasonal Vaccine 76yrs & older 01/12/2023   Moderna SARS-COV2 Booster Vaccination 03/07/2020, 10/12/2020   Moderna Sars-Covid-2 Vaccination 05/05/2019, 06/03/2019   PFIZER(Purple Top)SARS-COV-2 Vaccination 01/08/2021   Pneumococcal Conjugate-13 10/04/2020   Pneumococcal Polysaccharide-23 10/07/2021   Pneumococcal-Unspecified 10/04/2020   Tdap 01/07/2014   Unspecified SARS-COV-2 Vaccination 05/05/2019, 06/03/2019, 03/07/2020, 10/12/2020, 01/08/2021   Zoster Recombinant(Shingrix ) 04/24/2017, 06/19/2017   Zoster, Live 04/21/2011    Screening Tests Health Maintenance  Topic Date Due   COVID-19 Vaccine (8 - Mixed Product risk 2024-25 season) 09/03/2024 (Originally 07/13/2023)   DEXA SCAN  09/03/2023   MAMMOGRAM  09/05/2023   INFLUENZA VACCINE  10/23/2023   DTaP/Tdap/Td (2 - Td or Tdap) 01/08/2024   Medicare Annual Wellness (AWV)  08/18/2024   Colonoscopy  08/28/2026   Pneumonia Vaccine 72+ Years old  Completed   Hepatitis C Screening  Completed   Zoster Vaccines- Shingrix   Completed   HPV VACCINES  Aged Out   Meningococcal B Vaccine  Aged Out    Health Maintenance  There are no preventive care reminders to display for this patient.  Health Maintenance Items Addressed: See Nurse Notes  Additional Screening:  Vision Screening: Recommended annual ophthalmology exams for early detection of glaucoma and other disorders of the eye.  Dental Screening: Recommended annual dental exams for proper oral hygiene  Community Resource Referral / Chronic Care Management: CRR required this visit?  No   CCM required this visit?  No   Plan:    I have personally reviewed and noted the following in the patient's chart:   Medical and social history Use of  alcohol, tobacco or illicit drugs  Current medications and supplements including opioid prescriptions. Patient is not currently taking opioid prescriptions. Functional ability and status Nutritional status Physical activity Advanced directives List of other physicians Hospitalizations, surgeries, and ER visits in previous 12 months Vitals Screenings to include cognitive, depression, and falls Referrals and appointments  In addition, I have reviewed and discussed with patient certain preventive protocols, quality metrics, and best practice recommendations. A written personalized care plan for preventive services as well as general preventive health recommendations were provided to patient.   Michaelle Adolphus, CMA   08/19/2023   After Visit Summary: (MyChart) Due to this being a telephonic visit, the after visit summary with patients personalized plan was offered to patient via MyChart   Notes: Nothing significant to report at this time.

## 2023-09-23 DIAGNOSIS — M8588 Other specified disorders of bone density and structure, other site: Secondary | ICD-10-CM | POA: Diagnosis not present

## 2023-09-23 DIAGNOSIS — Z1231 Encounter for screening mammogram for malignant neoplasm of breast: Secondary | ICD-10-CM | POA: Diagnosis not present

## 2023-09-23 LAB — HM DEXA SCAN

## 2023-09-23 LAB — HM MAMMOGRAPHY

## 2023-09-30 LAB — HM PAP SMEAR: HPV, high-risk: NEGATIVE

## 2023-10-06 DIAGNOSIS — M8580 Other specified disorders of bone density and structure, unspecified site: Secondary | ICD-10-CM | POA: Diagnosis not present

## 2023-10-07 LAB — LAB REPORT - SCANNED: TSH: 1.35 (ref 0.41–5.90)

## 2023-11-19 ENCOUNTER — Encounter: Payer: Self-pay | Admitting: Family

## 2023-11-19 ENCOUNTER — Ambulatory Visit: Payer: Medicare Other | Admitting: Family

## 2023-11-19 VITALS — BP 138/69 | HR 63 | Temp 96.8°F | Ht 63.0 in | Wt 174.6 lb

## 2023-11-19 DIAGNOSIS — Z0001 Encounter for general adult medical examination with abnormal findings: Secondary | ICD-10-CM

## 2023-11-19 DIAGNOSIS — K219 Gastro-esophageal reflux disease without esophagitis: Secondary | ICD-10-CM

## 2023-11-19 DIAGNOSIS — Z0000 Encounter for general adult medical examination without abnormal findings: Secondary | ICD-10-CM

## 2023-11-19 DIAGNOSIS — M25562 Pain in left knee: Secondary | ICD-10-CM

## 2023-11-19 DIAGNOSIS — I10 Essential (primary) hypertension: Secondary | ICD-10-CM | POA: Diagnosis not present

## 2023-11-19 DIAGNOSIS — E669 Obesity, unspecified: Secondary | ICD-10-CM

## 2023-11-19 DIAGNOSIS — M8580 Other specified disorders of bone density and structure, unspecified site: Secondary | ICD-10-CM | POA: Diagnosis not present

## 2023-11-19 DIAGNOSIS — I872 Venous insufficiency (chronic) (peripheral): Secondary | ICD-10-CM | POA: Diagnosis not present

## 2023-11-19 DIAGNOSIS — F325 Major depressive disorder, single episode, in full remission: Secondary | ICD-10-CM

## 2023-11-19 DIAGNOSIS — E785 Hyperlipidemia, unspecified: Secondary | ICD-10-CM | POA: Diagnosis not present

## 2023-11-19 LAB — LIPID PANEL

## 2023-11-19 MED ORDER — DICLOFENAC SODIUM 1 % EX GEL: 2.0000 g | Freq: Four times a day (QID) | CUTANEOUS | Status: AC

## 2023-11-19 NOTE — Patient Instructions (Signed)
Chronic Venous Insufficiency Chronic venous insufficiency is a condition that causes the veins in the legs to struggle to pump blood from the legs to the heart. It is also called venous stasis. This condition can happen when the vein walls are stretched, weakened, or damaged. It can also happen when the valves inside the vein are damaged. With the right treatment, you should be able to still lead an active life. What are the causes? Common causes of this condition include: Venous hypertension. This is high blood pressure inside the veins. Sitting or standing too long. This can cause increased blood pressure in the veins of the leg. Deep vein thrombosis (DVT). This is a blood clot that blocks blood flow in a vein. Phlebitis. This is inflammation of a vein. It can cause a blood clot to form. An abnormal growth of cells (tumor) in the area between your hip bones (pelvis). This can cause blood to back up. What increases the risk? Factors that may make you more likely to get this condition include: Having a family history of the condition. Being overweight. Being pregnant. Not getting enough exercise. Smoking. Having a job that requires you to sit or stand in one place for a long time. Being a certain age. Females in their 23s and 5s and males in their 20s are more likely to get this condition. What are the signs or symptoms? Symptoms of this condition include: Varicose veins. These are veins that are enlarged, bulging, or twisted. Skin breakdown or ulcers. Reddened skin or dark discoloration of the skin on the leg between the knee and ankle. Lipodermatosclerosis. This is brown, smooth, tight, and painful skin just above the ankle. It is often on the inside of the leg. Swelling of the legs. How is this diagnosed? This condition may be diagnosed based on your medical history and a physical exam. You may also need tests, such as: A duplex ultrasound. This shows how blood flows through a blood  vessel. Plethysmography. This tests blood flow. Venogram. This looks at the veins using an X-ray and dye. How is this treated? The goals of treatment are to help you return to an active life and to relieve pain. Treatment may include: Wearing compression stockings. These do not cure the condition but can help relieve symptoms. They can also help stop your condition from getting worse. Sclerotherapy. This involves injecting a solution to shrink damaged veins. Surgery. This may include: Vein stripping. This is when a diseased vein is taken out. Laser ablation surgery. This is when blood flow is cut off through the vein. Repairing or remaking a valve inside the affected vein. Follow these instructions at home: Lifestyle Do not use any products that contain nicotine or tobacco. These products include cigarettes, chewing tobacco, and vaping devices, such as e-cigarettes. If you need help quitting, ask your health care provider. Stay active. Exercise, walk, or do other activities. Ask your provider what activities are safe for you. General instructions Take over-the-counter and prescription medicines only as told by your provider. Drink enough fluid to keep your pee (urine) pale yellow. Wear compression stockings as told by your provider. These stockings help to prevent blood clots and reduce swelling in your legs. Keep all follow-up visits. Your provider will check your legs for any changes and adjust your treatment plan as needed. Contact a health care provider if: You have redness, swelling, or more pain in the affected area. You see a red streak or line that goes up or down from the  area. You have skin breakdown or skin loss. You get an injury in the affected area. You get a fever. Get help right away if: You have severe pain that does not get better with medicine. You get an injury and an open wound in the affected area. Your foot or ankle becomes numb or weak all of a sudden. You have  trouble moving your foot or ankle. Your symptoms do not go away or get worse. You have chest pain. You have shortness of breath. These symptoms may be an emergency. Get help right away. Call 911. Do not wait to see if the symptoms will go away. Do not drive yourself to the hospital. This information is not intended to replace advice given to you by your health care provider. Make sure you discuss any questions you have with your health care provider. Document Revised: 03/25/2022 Document Reviewed: 03/25/2022 Elsevier Patient Education  2024 [employer].

## 2023-11-19 NOTE — Progress Notes (Signed)
 Subjective:    Patient ID: Gina Murray, female    DOB: [birth year], 68 y.o.   MRN: [id number]  Chief Complaint  Patient presents with   Medical Management of Chronic Issues    Hot flashes   Knee Pain    In the back knee pain   Pt presents to the office today for CPE.   She is followed by Gyn annually.   Had her Dexa scan 09/23/23 that showed Osteopenia. She is taking Calcium  and Vit D. Trying to incorporate some weights in her walking.    She retired 09/20/21. Hypertension This is a chronic problem. The current episode started more than 1 year ago. The problem has been resolved since onset. The problem is controlled. Pertinent negatives include no malaise/fatigue, peripheral edema or shortness of breath. Risk factors for coronary artery disease include dyslipidemia, obesity and sedentary lifestyle. The current treatment provides moderate improvement.  Gastroesophageal Reflux She complains of belching and heartburn. This is a chronic problem. The current episode started more than 1 year ago. The problem occurs occasionally. The symptoms are aggravated by certain foods. Risk factors include obesity. She has tried a PPI for the symptoms. The treatment provided moderate relief.  Hyperlipidemia This is a chronic problem. The current episode started more than 1 year ago. The problem is controlled. Recent lipid tests were reviewed and are normal. Exacerbating diseases include obesity. Pertinent negatives include no shortness of breath. Current antihyperlipidemic treatment includes statins. The current treatment provides moderate improvement of lipids. Risk factors for coronary artery disease include dyslipidemia, hypertension, a sedentary lifestyle and obesity.  Depression        This is a chronic problem.  The current episode started more than 1 year ago.   The problem occurs intermittently.  Associated symptoms include sad.  Associated symptoms include no helplessness and no hopelessness.   Past treatments include nothing. Knee Pain  The incident occurred more than 1 week ago. Injury mechanism: went on a longer walk. The pain is present in the left knee. The quality of the pain is described as aching. The pain is at a severity of 2/10. The pain is mild. She reports no foreign bodies present. The symptoms are aggravated by weight bearing. She has tried non-weight bearing for the symptoms. The treatment provided mild relief.      Review of Systems  Constitutional:  Negative for malaise/fatigue.  Respiratory:  Negative for shortness of breath.   Gastrointestinal:  Positive for heartburn.  All other systems reviewed and are negative.  Family History  Problem Relation Age of Onset   Hypertension Mother    Heart disease Mother 29   Atrial fibrillation Mother    Kidney disease Father        Dialysis   Vision loss Father    Lung cancer Sister    Social History   Socioeconomic History   Marital status: Married    Spouse name: Not on file   Number of children: 1   Years of education: Not on file   Highest education level: Associate degree: occupational, Scientist, product/process development, or vocational program  Occupational History   Not on file  Tobacco Use   Smoking status: Never   Smokeless tobacco: Never  Vaping Use   Vaping status: Never Used  Substance and Sexual Activity   Alcohol use: No   Drug use: No   Sexual activity: Not Currently    Birth control/protection: Post-menopausal  Other Topics Concern   Not on file  Social  History Narrative   Not on file   Social Drivers of Health   Financial Resource Strain: Low Risk  (11/12/2023)   Overall Financial Resource Strain (CARDIA)    Difficulty of Paying Living Expenses: Not hard at all  Food Insecurity: No Food Insecurity (11/12/2023)   Hunger Vital Sign    Worried About Running Out of Food in the Last Year: Never true    Ran Out of Food in the Last Year: Never true  Transportation Needs: No Transportation Needs (11/12/2023)    PRAPARE - Administrator, Civil Service (Medical): No    Lack of Transportation (Non-Medical): No  Physical Activity: Inactive (11/12/2023)   Exercise Vital Sign    Days of Exercise per Week: 0 days    Minutes of Exercise per Session: Not on file  Stress: Stress Concern Present (11/12/2023)   [employer] of Occupational Health - Occupational Stress Questionnaire    Feeling of Stress: To some extent  Social Connections: Moderately Isolated (11/12/2023)   Social Connection and Isolation Panel    Frequency of Communication with Friends and Family: Once a week    Frequency of Social Gatherings with Friends and Family: More than three times a week    Attends Religious Services: Patient declined    Database administrator or Organizations: No    Attends Engineer, structural: Not on file    Marital Status: Married       Objective:   Physical Exam Vitals reviewed.  Constitutional:      General: She is not in acute distress.    Appearance: She is well-developed.  HENT:     Head: Normocephalic and atraumatic.     Right Ear: Tympanic membrane normal.     Left Ear: Tympanic membrane normal.  Eyes:     Pupils: Pupils are equal, round, and reactive to light.  Neck:     Thyroid : No thyromegaly.  Cardiovascular:     Rate and Rhythm: Normal rate and regular rhythm.     Heart sounds: Normal heart sounds. No murmur heard. Pulmonary:     Effort: Pulmonary effort is normal. No respiratory distress.     Breath sounds: Normal breath sounds. No wheezing.  Abdominal:     General: Bowel sounds are normal. There is no distension.     Palpations: Abdomen is soft.     Tenderness: There is no abdominal tenderness.  Musculoskeletal:        General: No tenderness. Normal range of motion.     Cervical back: Normal range of motion and neck supple.     Comments: Discoloration of bilateral lower legs  Skin:    General: Skin is warm and dry.  Neurological:     Mental Status: She  is alert and oriented to person, place, and time.     Cranial Nerves: No cranial nerve deficit.     Deep Tendon Reflexes: Reflexes are normal and symmetric.  Psychiatric:        Mood and Affect: Mood is anxious.        Behavior: Behavior normal.        Thought Content: Thought content normal.        Judgment: Judgment normal.       BP 138/69   Pulse 63   Temp (!) 96.8 F (36 C) (Temporal)   Ht 5' 3 (1.6 m)   Wt 174 lb 9.6 oz (79.2 kg)   LMP 08/13/2001   SpO2 100%   BMI  30.93 kg/m      Assessment & Plan:  VERDIS BASSETTE comes in today with chief complaint of Medical Management of Chronic Issues (Hot flashes) and Knee Pain (In the back knee pain)   Diagnosis and orders addressed:  1. Annual physical exam (Primary) - CMP14+EGFR - CBC with Differential/Platelet - Lipid panel  2. Hyperlipidemia, unspecified hyperlipidemia type - CMP14+EGFR - CBC with Differential/Platelet - Lipid panel  3. Primary hypertension - CMP14+EGFR - CBC with Differential/Platelet  4. Gastroesophageal reflux disease, unspecified whether esophagitis present - CMP14+EGFR - CBC with Differential/Platelet  5. Depression, major, single episode, complete remission (HCC) - CMP14+EGFR - CBC with Differential/Platelet  6. Obesity (BMI 30-39.9) - CMP14+EGFR - CBC with Differential/Platelet  7. Chronic venous insufficiency - CMP14+EGFR - CBC with Differential/Platelet  8. Acute pain of left knee Rest Ice ROM exercises  - diclofenac  Sodium (VOLTAREN  ARTHRITIS PAIN) 1 % GEL; Apply 2 g topically 4 (four) times daily. - CMP14+EGFR - CBC with Differential/Platelet  9. Osteopenia, unspecified location Continue calcium  and vit D   Labs pending Continue current medications  Health Maintenance reviewed Diet and exercise encouraged  Follow up plan: 6 months    Bari Learn, FNP

## 2023-11-20 ENCOUNTER — Ambulatory Visit: Payer: Self-pay | Admitting: Family

## 2023-11-20 LAB — LIPID PANEL
Cholesterol, Total: 158 mg/dL (ref 100–199)
HDL: 69 mg/dL (ref >39)
LDL CALC COMMENT:: 2.3 ratio (ref 0.0–4.4)
LDL Chol Calc (NIH): 74 mg/dL (ref 0–99)
Triglycerides: 76 mg/dL (ref 0–149)
VLDL Cholesterol Cal: 15 mg/dL (ref 5–40)

## 2023-11-20 LAB — CBC WITH DIFFERENTIAL/PLATELET
Basophils Absolute: 0.1 x10E3/uL (ref 0.0–0.2)
Basos: 1 %
EOS (ABSOLUTE): 0.4 x10E3/uL (ref 0.0–0.4)
Eos: 6 %
Hematocrit: 39.5 % (ref 34.0–46.6)
Hemoglobin: 12.6 g/dL (ref 11.1–15.9)
Immature Grans (Abs): 0 x10E3/uL (ref 0.0–0.1)
Immature Granulocytes: 0 %
Lymphocytes Absolute: 2.1 x10E3/uL (ref 0.7–3.1)
Lymphs: 35 %
MCH: 28.6 pg (ref 26.6–33.0)
MCHC: 31.9 g/dL (ref 31.5–35.7)
MCV: 90 fL (ref 79–97)
Monocytes Absolute: 0.6 x10E3/uL (ref 0.1–0.9)
Monocytes: 10 %
Neutrophils Absolute: 2.9 x10E3/uL (ref 1.4–7.0)
Neutrophils: 48 %
Platelets: 254 x10E3/uL (ref 150–450)
RBC: 4.41 x10E6/uL (ref 3.77–5.28)
RDW: 13.8 % (ref 11.7–15.4)
WBC: 6.1 x10E3/uL (ref 3.4–10.8)

## 2023-11-20 LAB — CMP14+EGFR
ALT: 20 IU/L (ref 0–32)
AST: 24 IU/L (ref 0–40)
Albumin: 4.4 g/dL (ref 3.9–4.9)
Alkaline Phosphatase: 151 IU/L — AB (ref 44–121)
BUN/Creatinine Ratio: 19 (ref 12–28)
BUN: 18 mg/dL (ref 8–27)
Bilirubin Total: 0.5 mg/dL (ref 0.0–1.2)
CO2: 21 mmol/L (ref 20–29)
Calcium: 10.2 mg/dL (ref 8.7–10.3)
Chloride: 104 mmol/L (ref 96–106)
Creatinine, Ser: 0.94 mg/dL (ref 0.57–1.00)
Globulin, Total: 2.7 g/dL (ref 1.5–4.5)
Glucose: 95 mg/dL (ref 70–99)
Potassium: 4.6 mmol/L (ref 3.5–5.2)
Sodium: 142 mmol/L (ref 134–144)
Total Protein: 7.1 g/dL (ref 6.0–8.5)
eGFR: 66 mL/min/1.73 (ref >59)

## 2023-12-01 ENCOUNTER — Other Ambulatory Visit: Payer: Self-pay | Admitting: Family

## 2023-12-01 DIAGNOSIS — I10 Essential (primary) hypertension: Secondary | ICD-10-CM

## 2023-12-01 DIAGNOSIS — E785 Hyperlipidemia, unspecified: Secondary | ICD-10-CM

## 2023-12-25 ENCOUNTER — Other Ambulatory Visit: Payer: Self-pay | Admitting: Family

## 2023-12-25 DIAGNOSIS — I10 Essential (primary) hypertension: Secondary | ICD-10-CM

## 2023-12-27 ENCOUNTER — Other Ambulatory Visit: Payer: Self-pay | Admitting: Family

## 2023-12-27 DIAGNOSIS — K219 Gastro-esophageal reflux disease without esophagitis: Secondary | ICD-10-CM

## 2024-01-26 ENCOUNTER — Ambulatory Visit (INDEPENDENT_AMBULATORY_CARE_PROVIDER_SITE_OTHER): Admitting: *Deleted

## 2024-01-26 DIAGNOSIS — Z23 Encounter for immunization: Secondary | ICD-10-CM

## 2024-02-02 ENCOUNTER — Ambulatory Visit

## 2024-04-03 IMAGING — US US BREAST*R* LIMITED INC AXILLA
1 series · 5 of 5 positions shown · non-contrast
Comparison: Previous exam(s).

CLINICAL DATA: 66-year-old female presenting as a recall from
screening for possible right breast mass.

EXAM:
DIGITAL DIAGNOSTIC UNILATERAL RIGHT MAMMOGRAM WITH TOMOSYNTHESIS AND
CAD; ULTRASOUND RIGHT BREAST LIMITED
TECHNIQUE: Right digital diagnostic mammography and breast tomosynthesis was
performed. The images were evaluated with computer-aided detection.;
Targeted ultrasound examination of the right breast was performed

[Series 1: us breast*right* limited inc axilla · 0.07mm/px · 5 of 5 slices shown]
[im 1/5]
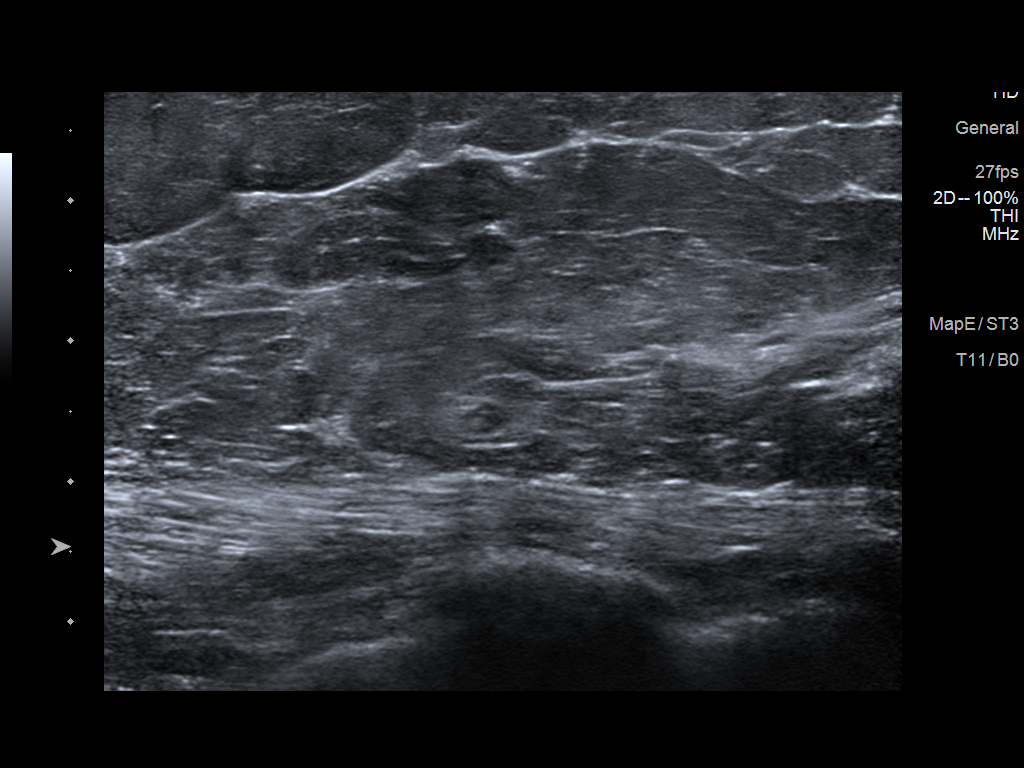
[im 2/5]
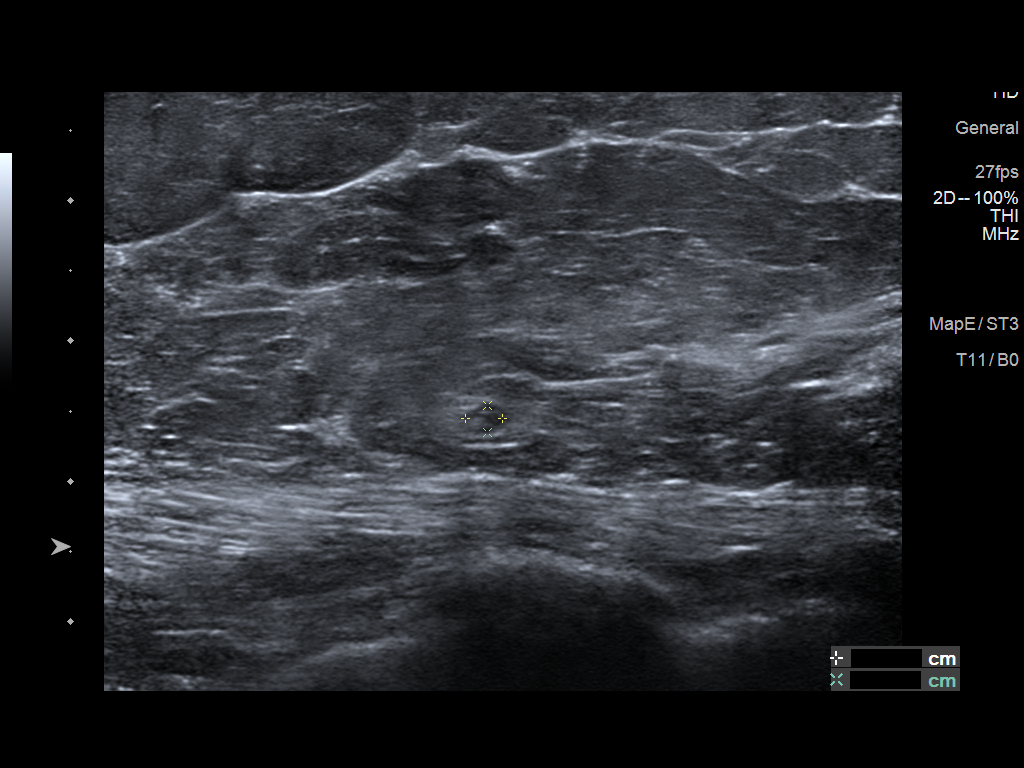
[im 3/5]
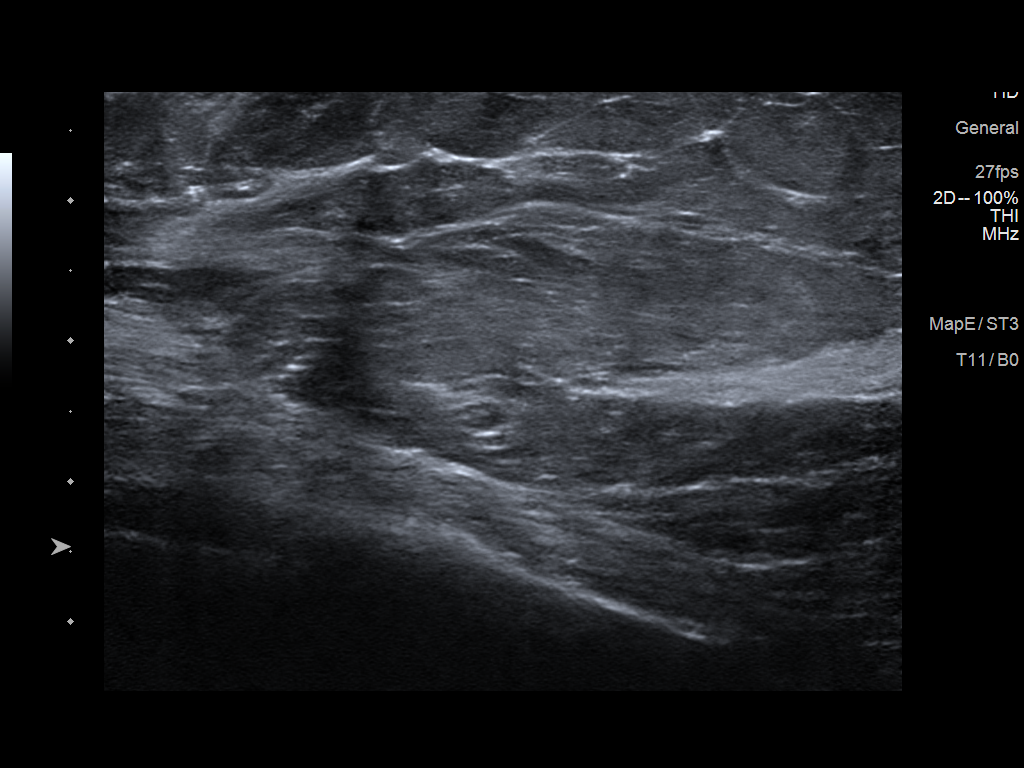
[im 4/5]
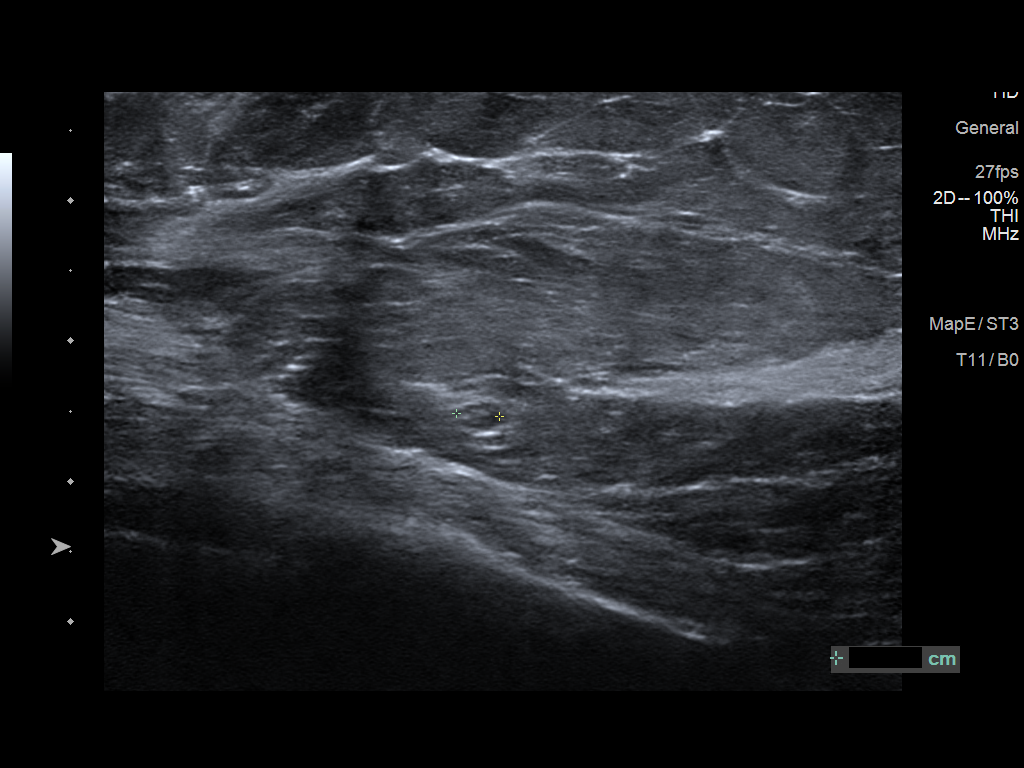
[im 5/5]
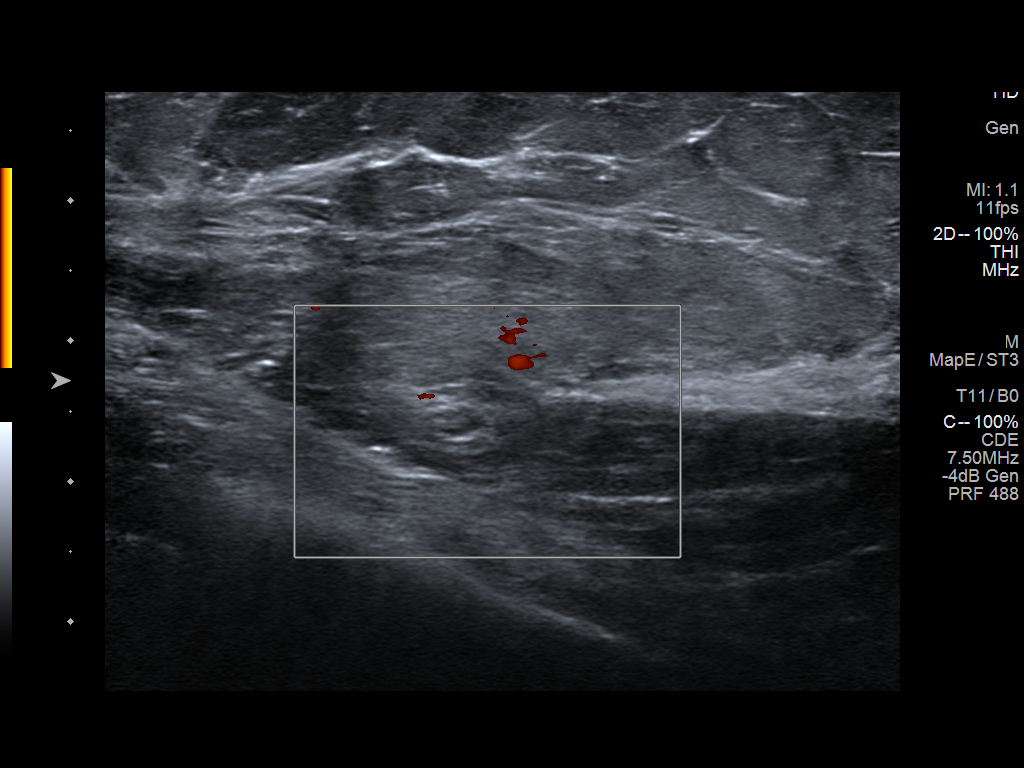

[5 of 5 positions shown; findings below may reference images not displayed]

ACR Breast Density Category b: There are scattered areas of
fibroglandular density.
FINDINGS: Mammogram:

Right breast: Spot compression tomosynthesis views of the right
breast were performed demonstrating persistence of a tiny oval mass
with probable fatty hilum in the outer posterior right breast
measuring approximately 0.3 cm.

Ultrasound:

Targeted ultrasound is performed in the right breast at 9 o'clock 8
cm from the nipple demonstrating an oval circumscribed hypoechoic
mass with fatty hilum measuring 0.3 x 0.2 x 0.3 cm, consistent with
a benign intramammary lymph node. This corresponds to the
mammographic finding.
IMPRESSION: Benign tiny intramammary lymph node in the right breast at 9
o'clock.

RECOMMENDATION:
Screening mammogram in one year.(Code:8G-7-NEN)

I have discussed the findings and recommendations with the patient.
If applicable, a reminder letter will be sent to the patient
regarding the next appointment.

BI-RADS CATEGORY  2: Benign.

## 2024-04-03 IMAGING — MG MM DIGITAL DIAGNOSTIC UNILAT*R* W/ TOMO W/ CAD
4 series · 4 of 12 positions shown · non-contrast
Comparison: Previous exam(s).

CLINICAL DATA: 66-year-old female presenting as a recall from
screening for possible right breast mass.

EXAM:
DIGITAL DIAGNOSTIC UNILATERAL RIGHT MAMMOGRAM WITH TOMOSYNTHESIS AND
CAD; ULTRASOUND RIGHT BREAST LIMITED
TECHNIQUE: Right digital diagnostic mammography and breast tomosynthesis was
performed. The images were evaluated with computer-aided detection.;
Targeted ultrasound examination of the right breast was performed

[R CC synth-2D]
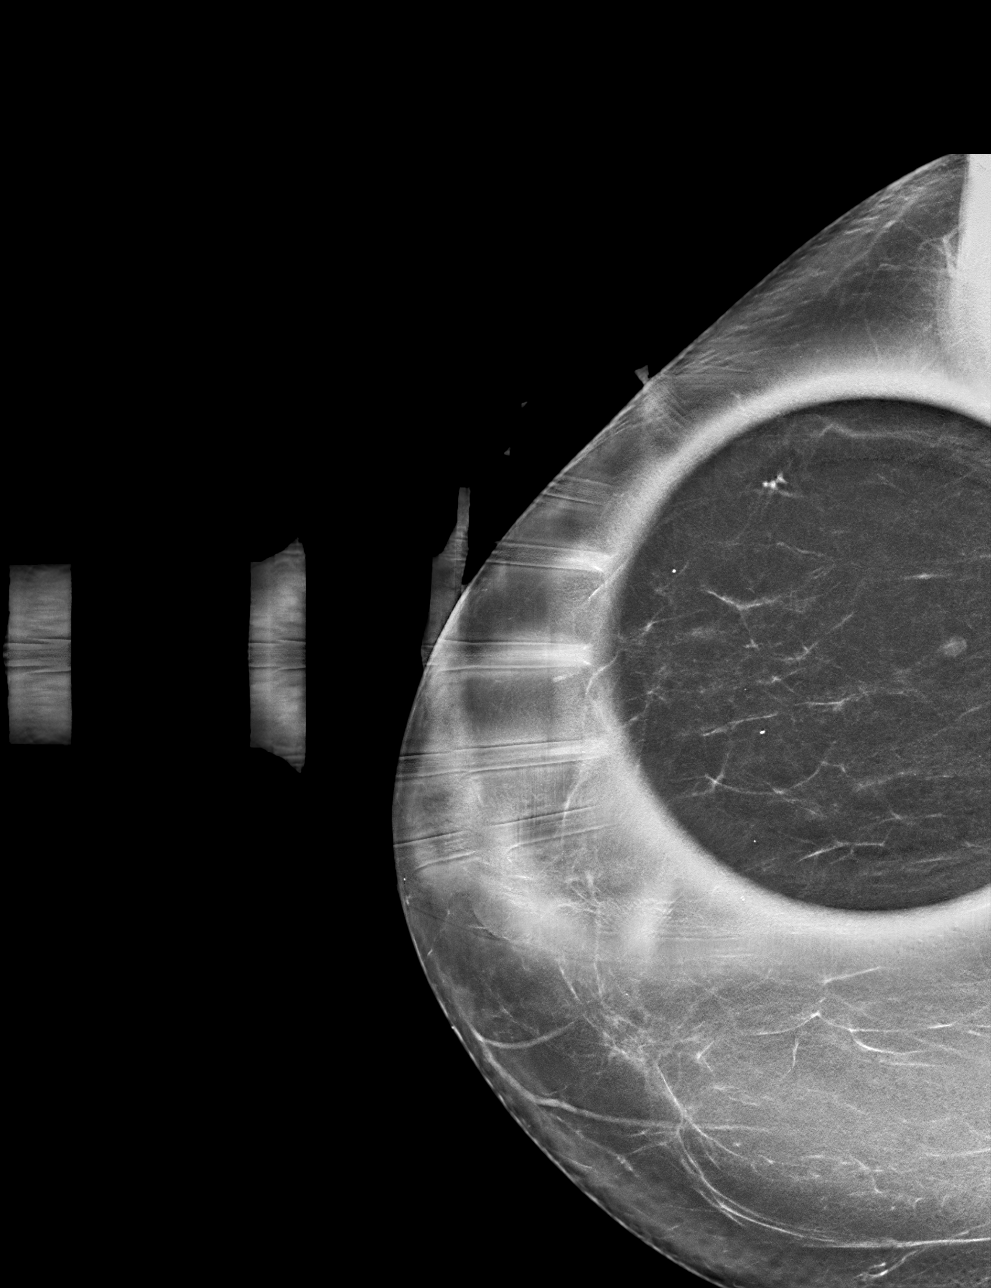

[R MLO synth-2D]
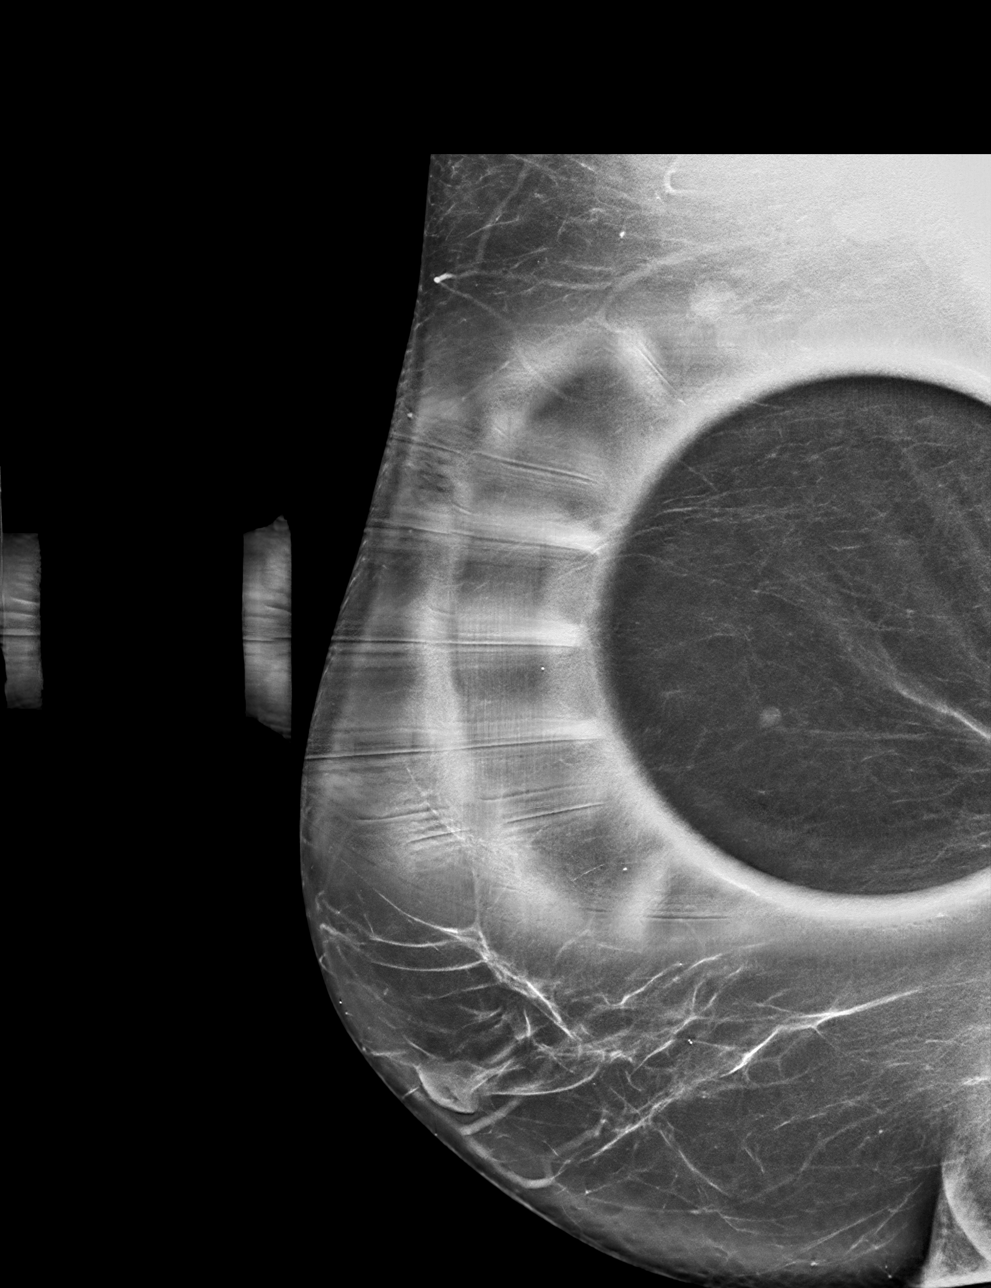

[R MLO tomo · tomo slice 39/76.0]
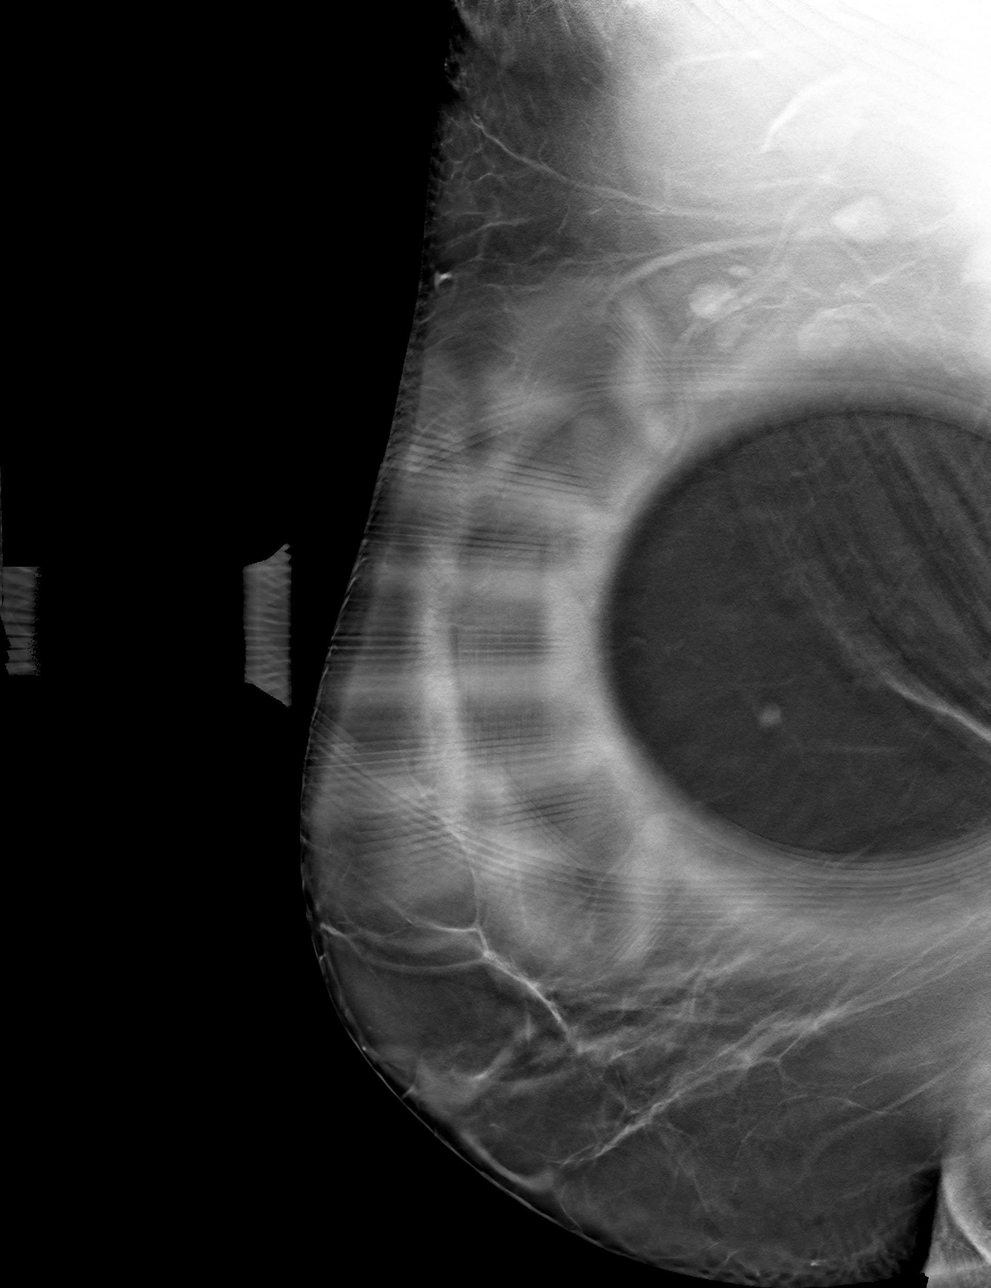

[R CC tomo · tomo slice 35/69.0]
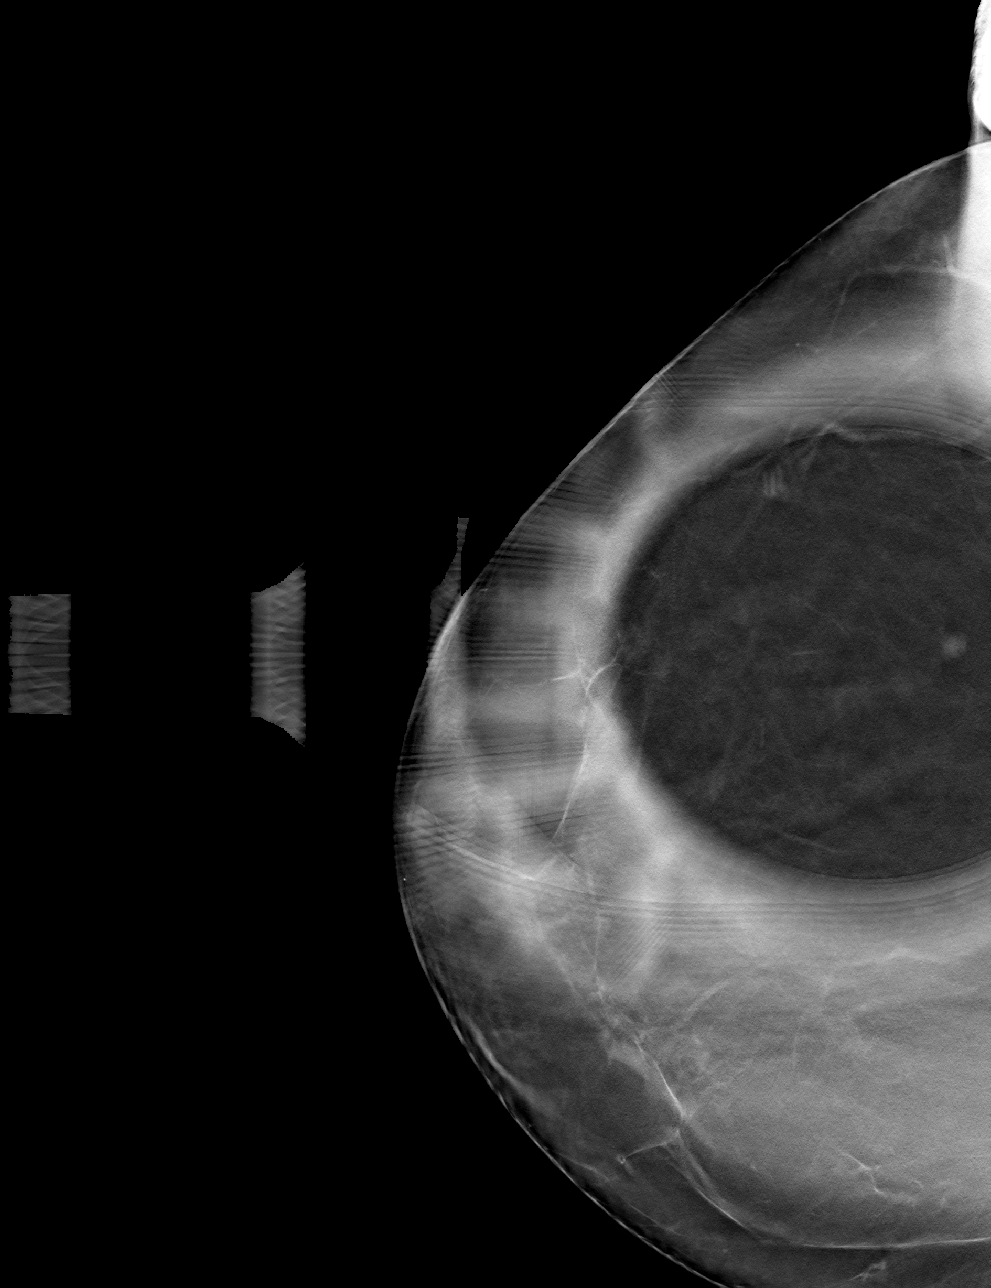

[4 of 12 positions shown; findings below may reference images not displayed]

ACR Breast Density Category b: There are scattered areas of
fibroglandular density.
FINDINGS: Mammogram:

Right breast: Spot compression tomosynthesis views of the right
breast were performed demonstrating persistence of a tiny oval mass
with probable fatty hilum in the outer posterior right breast
measuring approximately 0.3 cm.

Ultrasound:

Targeted ultrasound is performed in the right breast at 9 o'clock 8
cm from the nipple demonstrating an oval circumscribed hypoechoic
mass with fatty hilum measuring 0.3 x 0.2 x 0.3 cm, consistent with
a benign intramammary lymph node. This corresponds to the
mammographic finding.
IMPRESSION: Benign tiny intramammary lymph node in the right breast at 9
o'clock.

RECOMMENDATION:
Screening mammogram in one year.(Code:8G-7-NEN)

I have discussed the findings and recommendations with the patient.
If applicable, a reminder letter will be sent to the patient
regarding the next appointment.

BI-RADS CATEGORY  2: Benign.

## 2024-04-25 ENCOUNTER — Encounter (INDEPENDENT_AMBULATORY_CARE_PROVIDER_SITE_OTHER): Admitting: Ophthalmology

## 2024-05-20 ENCOUNTER — Ambulatory Visit: Payer: Self-pay | Admitting: Family

## 2024-05-24 ENCOUNTER — Encounter (INDEPENDENT_AMBULATORY_CARE_PROVIDER_SITE_OTHER): Admitting: Ophthalmology
